# Patient Record
Sex: Female | Born: 1995 | Race: White | Hispanic: No | Marital: Single | Smoking: Never smoker
Health system: Southern US, Community
[De-identification: ages and names within clinical notes are randomized; demographics above are authoritative.]

## PROBLEM LIST (undated history)

## (undated) DIAGNOSIS — F3181 Bipolar II disorder: Secondary | ICD-10-CM

## (undated) HISTORY — PX: EXPLORATORY LAPAROTOMY: SUR591

## (undated) HISTORY — PX: DIAGNOSTIC LAPAROSCOPY: SUR761

## (undated) HISTORY — DX: Bipolar II disorder: F31.81

---
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
## 2017-02-20 DIAGNOSIS — F3181 Bipolar II disorder: Secondary | ICD-10-CM

## 2017-02-20 HISTORY — DX: Bipolar II disorder: F31.81

## 2017-07-21 HISTORY — PX: PELVIC LAPAROSCOPY: SHX162

## 2017-11-09 ENCOUNTER — Inpatient Hospital Stay: Payer: BLUE CROSS/BLUE SHIELD

## 2017-11-09 ENCOUNTER — Inpatient Hospital Stay: Payer: BLUE CROSS/BLUE SHIELD | Attending: Obstetrics | Admitting: Obstetrics

## 2017-11-09 ENCOUNTER — Encounter: Payer: Self-pay | Admitting: Obstetrics

## 2017-11-09 VITALS — BP 136/65 | HR 68 | Temp 98.4°F | Resp 20 | Ht 66.0 in | Wt 207.3 lb

## 2017-11-09 DIAGNOSIS — D3910 Neoplasm of uncertain behavior of unspecified ovary: Secondary | ICD-10-CM | POA: Insufficient documentation

## 2017-11-09 DIAGNOSIS — N949 Unspecified condition associated with female genital organs and menstrual cycle: Secondary | ICD-10-CM

## 2017-11-09 DIAGNOSIS — N838 Other noninflammatory disorders of ovary, fallopian tube and broad ligament: Secondary | ICD-10-CM | POA: Insufficient documentation

## 2017-11-09 LAB — CEA (IN HOUSE-CHCC): CEA (CHCC-In House): 1.76 ng/mL (ref 0.00–5.00)

## 2017-11-09 NOTE — Progress Notes (Addendum)
Consult Note: New Patient FIRST VISIT   Consult was requested by Dr. Dollene Primrose for a large pelvic mass (cyst) that reformed after [facility] drainage.   Chief Complaint  Patient presents with  . Ovarian mass    ONCOLOGIC SUMMARY 1. TBD o .  HPI: [name]  is a nice 22 y.o.  P0  Patient states she has noted a mobile mass in her abdomen for approximately 5 years. She thought this was just part of her anatomy.  She noted about 2 weeks of discomfort that increased to signficant pain and presented to her Gyn. She was taken for urgent surgery after a large pelvic mass was diagnosed.  The patient underwent laparoscopic drainage of a 25 cm ovarian cyst per office notes fluid was obtained and consistent with benign cells in addition a biopsy of the ovarian capsule was performed and.  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  In this setting a mucinous component cannot be entirely excluded.  No atypia or malignancy is seen in the sample tissue.    The patient then presented for repeat ultrasound which revealed either residual or reformed cystic lesion: Trans-vaginal ultrasound 10/26/2017 showing a uterus 8 x 3.4 x 3 cm a left ovary 4.5 x 2.7 x 1.5 cm.  The right ovary was 4.4 x 4 x 2 cm with a cyst measuring 13 x 14 x 9 cm.  Description is bilateral ovaries are seen the right ovary has a thin-walled cyst measuring 3 cm with low-level echoes suspect hemorrhagic cyst it is avascular.  The left adnexa reveals a large thin-walled cyst which was seen transabdominally 14 x 9 x 16 hyperechoic area was seen on the posterior wall of the cyst which was avascular left ovary was seen at the fundus of the uterus with blood flow.  Cul-de-sac showed minimal free fluid.  IUD was seen within the endometrium in proper position.  She is referred for management and recommendations. She denies N/V. Has normal BM. Denies fevers. Has a good  appetite.  Imported EPIC Oncologic History:   No history exists.    Measurement of disease:  TBD .   Radiology: . TVUS as noted above; nothing available for formal review on first visit  Outpatient Encounter Medications as of 11/09/2017  Medication Sig  . lamoTRIgine (LAMICTAL) 200 MG tablet Take 1 tablet by mouth daily.  Marland Kitchen levonorgestrel (MIRENA) 20 MCG/24HR IUD by Intrauterine route.  . methylphenidate 36 MG PO CR tablet Take 36 mg by mouth as needed. Patient states that she takes this medication as needed on her bad days.  . Multiple Vitamin (MULTIVITAMIN) tablet Take 1 tablet by mouth daily.   No facility-administered encounter medications on file as of 11/09/2017.    No Known Allergies  Past Medical History:  Diagnosis Date  . Bipolar 2 disorder ([place]) 02/2017   Past Surgical History:  Procedure Laterality Date  . PELVIC LAPAROSCOPY  07/2017  . TONSILLECTOMY AND ADENOIDECTOMY  2003  . Wisedom Tooth extraction  06/2016        Past Gynecological History:   GYNECOLOGIC HISTORY:  . No LMP recorded.  . Menarche: 22 years old . P 0 . Contraceptive Mirena IUD . HRT NA  . Last Pap 08/2017 negative  Family Hx:  Family History  Problem Relation Age of Onset  . Uterine cancer Other   . Diabetes Mother   . Thyroid disease Mother   . Hypertension Father   .  Diabetes Maternal Grandmother   . Rheum arthritis Maternal Grandmother    Social Hx:  Marland Kitchen Tobacco use: +ECig . Alcohol use: none . Illicit Drug use: Marijuana    Review of Systems: Review of Systems  Gastrointestinal: Positive for abdominal pain.  All other systems reviewed and are negative.   Vitals: Blood pressure 136/65, pulse 68, temperature 98.4 F (36.9 C), temperature source Oral, resp. rate 20, height 5\' 6"  (1.676 m), weight 207 lb 4.8 oz (94 kg), SpO2 100 %. Vitals:   11/09/17 0916  Weight: 207 lb 4.8 oz (94 kg)  Height: 5\' 6"  (1.676 m)    Vitals:   11/09/17 0916  BP: 136/65  Pulse: 68  Resp:  20  Temp: 98.4 F (36.9 C)  SpO2: 100%   Body mass index is 33.46 kg/m.   Physical Exam: General :  Overweight. Well developed, 22 y.o., female in no apparent distress HEENT:  Normocephalic/atraumatic, symmetric, EOMI, eyelids normal Neck:   Supple, no masses.  Lymphatics:  No cervical/ submandibular/ supraclavicular/ infraclavicular/ inguinal adenopathy Respiratory:  Respirations unlabored, no use of accessory muscles CV:   Deferred Breast:  Deferred Musculoskeletal: No CVA tenderness, normal muscle strength. Abdomen:  Overweight. Soft, non-tender and nondistended. No evidence of hernia. No masses. Difficult exam 2/2 habitus. Extremities:  No lymphedema, no erythema, non-tender. Skin:   Normal inspection Neuro/Psych:  No focal motor deficit, no abnormal mental status. Normal gait. Normal affect. Alert and oriented to person, place, and time  Genito Urinary: Vulva: Normal external female genitalia.  Bladder/urethra: Urethral meatus normal in size and location. No lesions or   masses, well supported bladder Speculum exam: Vagina: No lesion, no discharge, no bleeding. Cervix: Normal appearing, no lesions. Bimanual exam: habitus limits exam Uterus: Mobile.  Adnexa: No masses. Rectovaginal:  Good tone, no masses, no cul de sac nodularity, no parametrial involvement or nodularity.   Assessment  Pelvic mass  Plan  1. Data reviewed ? No images are available for independent review ? I did review in detail and note above the referring provider's ultrasound ? I will order a CTScan prior to surgery and we'll get another measurement on the size and determine if any extrapelvic lesions ? I reviewed her referring doctor's office notes and I have summarized in the HPI ? History was obtained from the patient mostly and the chart partly ? We reviewed she has not yet had tumor markers and so those will be ordered. ? We also reviewed her washings and the pathology report. 2. Pelvic  mass ? Etiology most likely reformation of cystic lesion previously drained. ? CTAP as above  3. Management ? Given the lesion size I recommend surgical resection. ? I discussed attempt at laparoscopic removal, but potential for minilap or full Exlap, possible staging. ? Regardless of pathology we will plan fertility-sparing procedure and retention of the contralateral ovary and uterus. o Surgical risks, benefits, and alternatives were reviewed using the surgical sketch and she was given a copy. 4. She wants to wait for a school break to proceed. ? We have cytology and a sample of the lesion already. ? I would like to get the CT and tumor markers  ? If those are reassuring then we can look at scheduling out when she has holiday break. ? I did explain the risk of torsion/urgent surgery while at school.  HAS an IUD - Need to consider when placing uterine manipulator  Face to face time with patient was 60 minutes. Over 50% of this  time was spent on counseling and coordination of care.  [name], MD  11/09/2017, 9:48 AM    Cc: Dollene Primrose, MD (Referring Ob/Gyn) none (PCP)

## 2017-11-09 NOTE — Patient Instructions (Signed)
We will obtain tumor markers today (CEA, CA 125, CA 19-9) and notify you with the results.  Also plan to have a CT scan of the abdomen and pelvis.                Preparing for your Surgery  Plan for surgery on January 14, 2018 with Dr. Precious Haws at [place] will be scheduled for a robotic assisted unilateral salpingo-oophorectomy with washings, possible exploratory laparotomy, possible staging if malignancy is identified.  Pre-operative Testing -You will receive a phone call from presurgical testing at [facility] to arrange for a pre-operative testing appointment before your surgery.  This appointment normally occurs one to two weeks before your scheduled surgery.   -Bring your insurance card, copy of an advanced directive if applicable, medication list  -At that visit, you will be asked to sign a consent for a possible blood transfusion in case a transfusion becomes necessary during surgery.  The need for a blood transfusion is rare but having consent is a necessary part of your care.     -You should not be taking blood thinners or aspirin at least ten days prior to surgery unless instructed by your surgeon.  Day Before Surgery at [place] will be asked to take in a light diet the day before surgery.  Avoid carbonated beverages.  You will be advised to have nothing to eat or drink after midnight the evening before.    Eat a light diet the day before surgery.  Examples including soups, broths, toast, yogurt, mashed potatoes.  Things to avoid include carbonated beverages (fizzy beverages), raw fruits and raw vegetables, or beans.   If your bowels are filled with gas, your surgeon will have difficulty visualizing your pelvic organs which increases your surgical risks.  Your role in recovery Your role is to become active as soon as directed by your doctor, while still giving yourself time to heal.  Rest when you feel tired. You will be asked to do the following  in order to speed your recovery:  - Cough and breathe deeply. This helps toclear and expand your lungs and can prevent pneumonia. You may be given a spirometer to practice deep breathing. A staff member will show you how to use the spirometer. - Do mild physical activity. Walking or moving your legs help your circulation and body functions return to normal. A staff member will help you when you try to walk and will provide you with simple exercises. Do not try to get up or walk alone the first time. - Actively manage your pain. Managing your pain lets you move in comfort. We will ask you to rate your pain on a scale of zero to 10. It is your responsibility to tell your doctor or nurse where and how much you hurt so your pain can be treated.  Special Considerations -If you are diabetic, you may be placed on insulin after surgery to have closer control over your blood sugars to promote healing and recovery.  This does not mean that you will be discharged on insulin.  If applicable, your oral antidiabetics will be resumed when you are tolerating a solid diet.  -Your final pathology results from surgery should be available by the Friday after surgery and the results will be relayed to you when available.  -Dr. Everitt Amber is the Surgeon that assists your GYN Oncologist with surgery.  The next day after your surgery you will either see your  GYN Oncologist, Dr. Denman George, or Dr. Lahoma Crocker.   Blood Transfusion Information WHAT IS A BLOOD TRANSFUSION? A transfusion is the replacement of blood or some of its parts. Blood is made up of multiple cells which provide different functions.  Red blood cells carry oxygen and are used for blood loss replacement.  White blood cells fight against infection.  Platelets control bleeding.  Plasma helps clot blood.  Other blood products are available for specialized needs, such as hemophilia or other clotting disorders. BEFORE THE TRANSFUSION  Who gives  blood for transfusions?   You may be able to donate blood to be used at a later date on yourself (autologous donation).  Relatives can be asked to donate blood. This is generally not any safer than if you have received blood from a stranger. The same precautions are taken to ensure safety when a relative's blood is donated.  Healthy volunteers who are fully evaluated to make sure their blood is safe. This is blood bank blood. Transfusion therapy is the safest it has ever been in the practice of medicine. Before blood is taken from a donor, a complete history is taken to make sure that person has no history of diseases nor engages in risky social behavior (examples are intravenous drug use or sexual activity with multiple partners). The donor's travel history is screened to minimize risk of transmitting infections, such as malaria. The donated blood is tested for signs of infectious diseases, such as HIV and hepatitis. The blood is then tested to be sure it is compatible with you in order to minimize the chance of a transfusion reaction. If you or a relative donates blood, this is often done in anticipation of surgery and is not appropriate for emergency situations. It takes many days to process the donated blood. RISKS AND COMPLICATIONS Although transfusion therapy is very safe and saves many lives, the main dangers of transfusion include:   Getting an infectious disease.  Developing a transfusion reaction. This is an allergic reaction to something in the blood you were given. Every precaution is taken to prevent this. The decision to have a blood transfusion has been considered carefully by your caregiver before blood is given. Blood is not given unless the benefits outweigh the risks.

## 2017-11-10 LAB — CA 125: Cancer Antigen (CA) 125: 23.4 U/mL (ref 0.0–38.1)

## 2017-11-10 LAB — CANCER ANTIGEN 19-9: CAN 19-9: 9 U/mL (ref 0–35)

## 2017-11-12 ENCOUNTER — Telehealth: Payer: Self-pay

## 2017-11-12 NOTE — Telephone Encounter (Signed)
Told Ms Warning that the CEA and CA-125 were with in normal range per Joylene John, NP. Pt verbalized understanding.

## 2017-11-16 ENCOUNTER — Ambulatory Visit (HOSPITAL_COMMUNITY)
Admission: RE | Admit: 2017-11-16 | Discharge: 2017-11-16 | Disposition: A | Discharge status: Home / Self Care | Payer: BLUE CROSS/BLUE SHIELD | Source: Ambulatory Visit | Attending: Obstetrics | Admitting: Obstetrics

## 2017-11-16 ENCOUNTER — Encounter (HOSPITAL_COMMUNITY): Payer: Self-pay | Admitting: Radiology

## 2017-11-16 DIAGNOSIS — K769 Liver disease, unspecified: Secondary | ICD-10-CM | POA: Diagnosis not present

## 2017-11-16 DIAGNOSIS — R188 Other ascites: Secondary | ICD-10-CM | POA: Diagnosis not present

## 2017-11-16 DIAGNOSIS — N949 Unspecified condition associated with female genital organs and menstrual cycle: Secondary | ICD-10-CM

## 2017-11-16 DIAGNOSIS — N9489 Other specified conditions associated with female genital organs and menstrual cycle: Secondary | ICD-10-CM | POA: Diagnosis not present

## 2017-11-16 MED ORDER — IOHEXOL 300 MG/ML  SOLN
75.0000 mL | Freq: Once | INTRAMUSCULAR | Status: AC | PRN
  Administered 2017-11-16: 75 mL via INTRAVENOUS

## 2017-11-16 MED ORDER — SODIUM CHLORIDE 0.9 % IJ SOLN
INTRAMUSCULAR | Status: AC
  Filled 2017-11-16: qty 50

## 2017-11-22 ENCOUNTER — Telehealth: Payer: Self-pay

## 2017-11-22 DIAGNOSIS — N838 Other noninflammatory disorders of ovary, fallopian tube and broad ligament: Secondary | ICD-10-CM

## 2017-11-22 DIAGNOSIS — Q991 46, XX true hermaphrodite: Secondary | ICD-10-CM

## 2017-11-22 DIAGNOSIS — K769 Liver disease, unspecified: Secondary | ICD-10-CM

## 2017-11-22 NOTE — Telephone Encounter (Signed)
Told Kaylee Porter that the CT showed the cystic mass that was previously known.  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 An abdominal MRI is recommended to evaluate the lesion per [facility]. 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

## 2017-12-07 NOTE — Telephone Encounter (Signed)
Ms Schey is working on a project for school at the moment.  She will call back next week with dates that she can have MRI of the Abdomen doe prior to surgery 01-14-18.

## 2017-12-21 NOTE — Telephone Encounter (Signed)
Spoke with Kaylee Porter and Kaylee Porter her about dates for MRI as Dr. Gerarda Fraction would like it prior to her surgery on 01-14-18.  Kaylee Porter stated that she was at work and would call later to discuss scheduling the MRI.

## 2018-01-02 ENCOUNTER — Telehealth: Payer: Self-pay | Admitting: Oncology

## 2018-01-02 NOTE — Telephone Encounter (Signed)
Called Kaylee Porter with appointment for pre-op testing on 01/15/18 at 8:30 am and then MRI at 10 am.  Advised her not to eat or drink anything 4 hours before the MRI.  She verbalized understanding and agreement.

## 2018-01-07 ENCOUNTER — Telehealth: Payer: Self-pay | Admitting: Oncology

## 2018-01-07 NOTE — Telephone Encounter (Signed)
Called Kaylee Porter regarding her insurance card.  She said she forgot to email it and will send it right now.

## 2018-01-09 ENCOUNTER — Telehealth: Payer: Self-pay | Admitting: Oncology

## 2018-01-09 NOTE — Telephone Encounter (Signed)
Left a message for Beth regarding insurance.  Requested a return call.

## 2018-01-11 ENCOUNTER — Inpatient Hospital Stay (HOSPITAL_COMMUNITY): Admission: RE | Admit: 2018-01-11 | Payer: BLUE CROSS/BLUE SHIELD | Source: Ambulatory Visit

## 2018-01-15 ENCOUNTER — Other Ambulatory Visit (HOSPITAL_COMMUNITY): Payer: BLUE CROSS/BLUE SHIELD

## 2018-01-15 ENCOUNTER — Ambulatory Visit (HOSPITAL_COMMUNITY): Admission: RE | Admit: 2018-01-15 | Payer: Managed Care, Other (non HMO) | Source: Ambulatory Visit

## 2018-01-21 ENCOUNTER — Ambulatory Visit: Payer: BLUE CROSS/BLUE SHIELD | Admitting: Obstetrics

## 2018-02-01 ENCOUNTER — Telehealth: Payer: Self-pay

## 2018-02-01 NOTE — Telephone Encounter (Signed)
Outgoing call per Joylene John NP regarding MRI results from this morning.  Per her, MRI results are "they feel like the area in the liver is benign, recommend follow up MRI in one year to make sure it is stable"  Voiced understanding. Per Melissa NP - I let pt know they will do further testing on tissue when she has her surgery and f/u MRI can be with her primary or PCP.  Pt voiced understanding and no other needs per her at this time.

## 2018-02-01 NOTE — Patient Instructions (Signed)
Kaylee Porter  02/01/2018   Your procedure is scheduled on: 02-12-18    Report to [facility] Main  Entrance    Report to admitting at 11:45 AM    Call this number if you have problems the morning of surgery [phone]    Eat a light diet the day before surgery.  Examples including soups, broths, toast, yogurt, mashed potatoes.  Things to avoid include carbonated beverages (fizzy beverages), raw fruits and raw vegetables, or beans.   If your bowels are filled with gas, your surgeon will have difficulty visualizing your pelvic organs which increases your surgical risks.   Remember: NO SOLID FOOD AFTER MIDNIGHT THE NIGHT PRIOR TO SURGERY. NOTHING BY MOUTH EXCEPT CLEAR LIQUIDS UNTIL 3 HOURS PRIOR TO [place] SURGERY. PLEASE FINISH ENSURE DRINK PER SURGEON ORDER 3 HOURS PRIOR TO SCHEDULED SURGERY TIME WHICH NEEDS TO BE COMPLETED AT 10:45 AM.    CLEAR LIQUID DIET   Foods Allowed                                                                     Foods Excluded  Coffee and tea, regular and decaf                             liquids that you cannot  Plain Jell-O in any flavor                                             see through such as: Fruit ices (not with fruit pulp)                                     milk, soups, orange juice  Iced Popsicles                                    All solid food Carbonated beverages, regular and diet                                    Cranberry, grape and apple juices Sports drinks like Gatorade Lightly seasoned clear broth or consume(fat free) Sugar, honey syrup  Sample Menu Breakfast                                Lunch                                     Supper Cranberry juice                    Beef broth  Chicken broth Jell-O                                     Grape juice                           Apple juice Coffee or tea                        Jell-O                                       Popsicle                                                Coffee or tea                        Coffee or tea  _____________________________________________________________________    BRUSH YOUR TEETH MORNING OF SURGERY AND RINSE YOUR MOUTH OUT, NO CHEWING GUM CANDY OR MINTS.     Take these medicines the morning of surgery with A SIP OF WATER: None                                 You may not have any metal on your body including hair pins and              piercings  Do not wear jewelry, make-up, lotions, powders or perfumes, deodorant             Do not wear nail polish.  Do not shave  48 hours prior to surgery.        Do not bring valuables to the hospital. [place].  Contacts, dentures or bridgework may not be worn into surgery.  Leave suitcase in the car. After surgery it may be brought to your room.      Special Instructions: Cough and Deep Breath Exercises              Please read over the following fact sheets you were given: _____________________________________________________________________             [facility] - Preparing for Surgery Before surgery, you can play an important role.  Because skin is not sterile, your skin needs to be as free of germs as possible.  You can reduce the number of germs on your skin by washing with CHG (chlorahexidine gluconate) soap before surgery.  CHG is an antiseptic cleaner which kills germs and bonds with the skin to continue killing germs even after washing. Please DO NOT use if you have an allergy to CHG or antibacterial soaps.  If your skin becomes reddened/irritated stop using the CHG and inform your nurse when you arrive at Short Stay. Do not shave (including legs and underarms) for at least 48 hours prior to the first CHG shower.  You may shave your face/neck. Please follow these instructions carefully:  1.  Shower with CHG Soap the night  before surgery and the  morning of  Surgery.  2.  If you choose to wash your hair, wash your hair first as usual with your  normal  shampoo.  3.  After you shampoo, rinse your hair and body thoroughly to remove the  shampoo.                           4.  Use CHG as you would any other liquid soap.  You can apply chg directly  to the skin and wash                       Gently with a scrungie or clean washcloth.  5.  Apply the CHG Soap to your body ONLY FROM THE NECK DOWN.   Do not use on face/ open                           Wound or open sores. Avoid contact with eyes, ears mouth and genitals (private parts).                       Wash face,  Genitals (private parts) with your normal soap.             6.  Wash thoroughly, paying special attention to the area where your surgery  will be performed.  7.  Thoroughly rinse your body with warm water from the neck down.  8.  DO NOT shower/wash with your normal soap after using and rinsing off  the CHG Soap.                9.  Pat yourself dry with a clean towel.            10.  Wear clean pajamas.            11.  Place clean sheets on your bed the night of your first shower and do not  sleep with pets. Day of Surgery : Do not apply any lotions/deodorants the morning of surgery.  Please wear clean clothes to the hospital/surgery center.  FAILURE TO FOLLOW THESE INSTRUCTIONS MAY RESULT IN THE CANCELLATION OF YOUR SURGERY PATIENT SIGNATURE_________________________________  NURSE SIGNATURE__________________________________  ________________________________________________________________________   Adam [place]ix  An incentive spirometer is a tool that can help keep your lungs clear and active. This tool measures how well you are filling your lungs with each breath. Taking long deep breaths may help reverse or decrease the chance of developing breathing (pulmonary) problems (especially infection) following:  A long period of time when you are unable to move or be active. BEFORE  THE PROCEDURE   If the spirometer includes an indicator to show your best effort, your nurse or respiratory therapist will set it to a desired goal.  If possible, sit up straight or lean slightly forward. Try not to slouch.  Hold the incentive spirometer in an upright position. INSTRUCTIONS FOR USE  1. Sit on the edge of your bed if possible, or sit up as far as you can in bed or on a chair. 2. Hold the incentive spirometer in an upright position. 3. Breathe out normally. 4. Place the mouthpiece in your mouth and seal your lips tightly around it. 5. Breathe in slowly and as deeply as possible, raising the piston or the ball toward the top of the column. 6. Hold your breath for 3-5 seconds  or for as long as possible. Allow the piston or ball to fall to the bottom of the column. 7. Remove the mouthpiece from your mouth and breathe out normally. 8. Rest for a few seconds and repeat Steps 1 through 7 at least 10 times every 1-2 hours when you are awake. Take your time and take a few normal breaths between deep breaths. 9. The spirometer may include an indicator to show your best effort. Use the indicator as a goal to work toward during each repetition. 10. After each set of 10 deep breaths, practice coughing to be sure your lungs are clear. If you have an incision (the cut made at the time of surgery), support your incision when coughing by placing a pillow or rolled up towels firmly against it. Once you are able to get out of bed, walk around indoors and cough well. You may stop using the incentive spirometer when instructed by your caregiver.  RISKS AND COMPLICATIONS  Take your time so you do not get dizzy or light-headed.  If you are in pain, you may need to take or ask for pain medication before doing incentive spirometry. It is harder to take a deep breath if you are having pain. AFTER USE  Rest and breathe slowly and easily.  It can be helpful to keep track of a log of your progress.  Your caregiver can provide you with a simple table to help with this. If you are using the spirometer at home, follow these instructions: [place] IF:   You are having difficultly using the spirometer.  You have trouble using the spirometer as often as instructed.  Your pain medication is not giving enough relief while using the spirometer.  You develop fever of 100.5 F (38.1 C) or higher. SEEK IMMEDIATE MEDICAL CARE IF:   You cough up bloody sputum that had not been present before.  You develop fever of 102 F (38.9 C) or greater.  You develop worsening pain at or near the incision site. MAKE SURE YOU:   Understand these instructions.  Will watch your condition.  Will get help right away if you are not doing well or get worse. Document Released: 06/19/2006 Document Revised: 05/01/2011 Document Reviewed: 08/20/2006 Exit[employer] Patient Information 2014 [employer], [place].   ________________________________________________________________________  WHAT IS A BLOOD TRANSFUSION? Blood Transfusion Information  A transfusion is the replacement of blood or some of its parts. Blood is made up of multiple cells which provide different functions.  Red blood cells carry oxygen and are used for blood loss replacement.  White blood cells fight against infection.  Platelets control bleeding.  Plasma helps clot blood.  Other blood products are available for specialized needs, such as hemophilia or other clotting disorders. BEFORE THE TRANSFUSION  Who gives blood for transfusions?   Healthy volunteers who are fully evaluated to make sure their blood is safe. This is blood bank blood. Transfusion therapy is the safest it has ever been in the practice of medicine. Before blood is taken from a donor, a complete history is taken to make sure that person has no history of diseases nor engages in risky social behavior (examples are intravenous drug use or sexual activity with multiple  partners). The donor's travel history is screened to minimize risk of transmitting infections, such as malaria. The donated blood is tested for signs of infectious diseases, such as HIV and hepatitis. The blood is then tested to be sure it is compatible with you in order to  minimize the chance of a transfusion reaction. If you or a relative donates blood, this is often done in anticipation of surgery and is not appropriate for emergency situations. It takes many days to process the donated blood. RISKS AND COMPLICATIONS Although transfusion therapy is very safe and saves many lives, the main dangers of transfusion include:   Getting an infectious disease.  Developing a transfusion reaction. This is an allergic reaction to something in the blood you were given. Every precaution is taken to prevent this. The decision to have a blood transfusion has been considered carefully by your caregiver before blood is given. Blood is not given unless the benefits outweigh the risks. AFTER THE TRANSFUSION  Right after receiving a blood transfusion, you will usually feel much better and more energetic. This is especially true if your red blood cells have gotten low (anemic). The transfusion raises the level of the red blood cells which carry oxygen, and this usually causes an energy increase.  The nurse administering the transfusion will monitor you carefully for complications. HOME CARE INSTRUCTIONS  No special instructions are needed after a transfusion. You may find your energy is better. Speak with your caregiver about any limitations on activity for underlying diseases you may have. SEEK MEDICAL CARE IF:   Your condition is not improving after your transfusion.  You develop redness or irritation at the intravenous (IV) site. SEEK IMMEDIATE MEDICAL CARE IF:  Any of the following symptoms occur over the next 12 hours:  Shaking chills.  You have a temperature by mouth above 102 F (38.9 C), not  controlled by medicine.  Chest, back, or muscle pain.  People around you feel you are not acting correctly or are confused.  Shortness of breath or difficulty breathing.  Dizziness and fainting.  You get a rash or develop hives.  You have a decrease in urine output.  Your urine turns a dark color or changes to pink, red, or brown. Any of the following symptoms occur over the next 10 days:  You have a temperature by mouth above 102 F (38.9 C), not controlled by medicine.  Shortness of breath.  Weakness after normal activity.  The white part of the eye turns yellow (jaundice).  You have a decrease in the amount of urine or are urinating less often.  Your urine turns a dark color or changes to pink, red, or brown. Document Released: 02/04/2000 Document Revised: 05/01/2011 Document Reviewed: 09/23/2007 [facility] Patient Information 2014 [place], [place].  _______________________________________________________________________

## 2018-02-04 ENCOUNTER — Inpatient Hospital Stay (HOSPITAL_COMMUNITY)
Admission: RE | Admit: 2018-02-04 | Discharge: 2018-02-04 | Disposition: A | Discharge status: Home / Self Care | Payer: Managed Care, Other (non HMO) | Source: Ambulatory Visit

## 2018-02-11 ENCOUNTER — Encounter (HOSPITAL_COMMUNITY): Payer: Self-pay

## 2018-02-11 ENCOUNTER — Telehealth: Payer: Self-pay | Admitting: *Deleted

## 2018-02-11 ENCOUNTER — Encounter (HOSPITAL_COMMUNITY)
Admission: RE | Admit: 2018-02-11 | Discharge: 2018-02-11 | Disposition: A | Discharge status: Home / Self Care | Payer: Managed Care, Other (non HMO) | Source: Ambulatory Visit | Attending: Obstetrics | Admitting: Obstetrics

## 2018-02-11 ENCOUNTER — Other Ambulatory Visit: Payer: Self-pay

## 2018-02-11 DIAGNOSIS — Z01812 Encounter for preprocedural laboratory examination: Secondary | ICD-10-CM | POA: Insufficient documentation

## 2018-02-11 DIAGNOSIS — N736 Female pelvic peritoneal adhesions (postinfective): Secondary | ICD-10-CM | POA: Diagnosis not present

## 2018-02-11 DIAGNOSIS — D4959 Neoplasm of unspecified behavior of other genitourinary organ: Secondary | ICD-10-CM | POA: Diagnosis not present

## 2018-02-11 DIAGNOSIS — R1900 Intra-abdominal and pelvic swelling, mass and lump, unspecified site: Secondary | ICD-10-CM | POA: Insufficient documentation

## 2018-02-11 LAB — URINALYSIS, ROUTINE W REFLEX MICROSCOPIC
Bacteria, UA: NONE SEEN
Bilirubin Urine: NEGATIVE
Glucose, UA: NEGATIVE mg/dL
Ketones, ur: 5 mg/dL — AB
Leukocytes, UA: NEGATIVE
NITRITE: NEGATIVE
Protein, ur: NEGATIVE mg/dL
Specific Gravity, Urine: 1.006 (ref 1.005–1.030)
pH: 6 (ref 5.0–8.0)

## 2018-02-11 LAB — COMPREHENSIVE METABOLIC PANEL
ALT: 14 U/L (ref 0–44)
AST: 20 U/L (ref 15–41)
Albumin: 4.6 g/dL (ref 3.5–5.0)
Alkaline Phosphatase: 54 U/L (ref 38–126)
Anion gap: 9 (ref 5–15)
BUN: 8 mg/dL (ref 6–20)
CHLORIDE: 105 mmol/L (ref 98–111)
CO2: 26 mmol/L (ref 22–32)
Calcium: 9.6 mg/dL (ref 8.9–10.3)
Creatinine, Ser: 0.74 mg/dL (ref 0.44–1.00)
GFR calc Af Amer: >60 mL/min (ref >60)
GFR calc non Af Amer: >60 mL/min (ref >60)
Glucose, Bld: 102 mg/dL — ABNORMAL HIGH (ref 70–99)
Potassium: 4 mmol/L (ref 3.5–5.1)
Sodium: 140 mmol/L (ref 135–145)
Total Bilirubin: 0.8 mg/dL (ref 0.3–1.2)
Total Protein: 6.8 g/dL (ref 6.5–8.1)

## 2018-02-11 LAB — CBC
HCT: 43.5 % (ref 36.0–46.0)
Hemoglobin: 13.2 g/dL (ref 12.0–15.0)
MCH: 26.2 pg (ref 26.0–34.0)
MCHC: 30.3 g/dL (ref 30.0–36.0)
MCV: 86.3 fL (ref 80.0–100.0)
NRBC: 0 % (ref 0.0–0.2)
Platelets: 234 10*3/uL (ref 150–400)
RBC: 5.04 MIL/uL (ref 3.87–5.11)
RDW: 14.4 % (ref 11.5–15.5)
WBC: 6.2 10*3/uL (ref 4.0–10.5)

## 2018-02-11 LAB — PREGNANCY, URINE: Preg Test, Ur: NEGATIVE

## 2018-02-11 LAB — ABO/RH: ABO/RH(D): O POS

## 2018-02-11 NOTE — Telephone Encounter (Signed)
Patient called back and was given the new appt for this afternoon

## 2018-02-11 NOTE — Telephone Encounter (Signed)
Called and left the patient a message to call the office back. Patient missed her per op appt this morning.

## 2018-02-11 NOTE — Patient Instructions (Addendum)
Kaylee Porter  02/11/2018   Your procedure is scheduled on: 02-12-18  Report to [facility] Main  Entrance  Report to admitting at         0830 AM    Call this number if you have problems the morning of surgery [phone]    Remember: NO SOLID FOOD AFTER MIDNIGHT THE NIGHT PRIOR TO SURGERY. NOTHING BY MOUTH EXCEPT CLEAR LIQUIDS UNTIL 3 HOURS PRIOR TO [place] SURGERY. PLEASE FINISH ENSURE DRINK PER SURGEON ORDER 3 HOURS PRIOR TO SCHEDULED SURGERY TIME WHICH NEEDS TO BE COMPLETED AT _______0730 am then nothing by mouth_____.    BRUSH YOUR TEETH MORNING OF SURGERY AND RINSE YOUR MOUTH OUT, NO CHEWING GUM CANDY OR MINTS.     Take these medicines the morning of surgery with A SIP OF WATER: none                                You may not have any metal on your body including hair pins and              piercings  Do not wear jewelry, make-up, lotions, powders or perfumes, deodorant             Do not wear nail polish.  Do not shave  48 hours prior to surgery.             Do not bring valuables to the hospital. [place].  Contacts, dentures or bridgework may not be worn into surgery.  Leave suitcase in the car. After surgery it may be brought to your room.     Patients discharged the day of surgery will not be allowed to drive home.  Name and phone number of your driver:  Special Instructions: N/A              Please read over the following fact sheets you were given: _____________________________________________________________________             [facility] - Preparing for Surgery Before surgery, you can play an important role.  Because skin is not sterile, your skin needs to be as free of germs as possible.  You can reduce the number of germs on your skin by washing with CHG (chlorahexidine gluconate) soap before surgery.  CHG is an antiseptic cleaner which kills germs and bonds with the skin to  continue killing germs even after washing. Please DO NOT use if you have an allergy to CHG or antibacterial soaps.  If your skin becomes reddened/irritated stop using the CHG and inform your nurse when you arrive at Short Stay. Do not shave (including legs and underarms) for at least 48 hours prior to the first CHG shower.  You may shave your face/neck. Please follow these instructions carefully:  1.  Shower with CHG Soap the night before surgery and the  morning of Surgery.  2.  If you choose to wash your hair, wash your hair first as usual with your  normal  shampoo.  3.  After you shampoo, rinse your hair and body thoroughly to remove the  shampoo.  4.  Use CHG as you would any other liquid soap.  You can apply chg directly  to the skin and wash                       Gently with a scrungie or clean washcloth.  5.  Apply the CHG Soap to your body ONLY FROM THE NECK DOWN.   Do not use on face/ open                           Wound or open sores. Avoid contact with eyes, ears mouth and genitals (private parts).                       Wash face,  Genitals (private parts) with your normal soap.             6.  Wash thoroughly, paying special attention to the area where your surgery  will be performed.  7.  Thoroughly rinse your body with warm water from the neck down.  8.  DO NOT shower/wash with your normal soap after using and rinsing off  the CHG Soap.                9.  Pat yourself dry with a clean towel.            10.  Wear clean pajamas.            11.  Place clean sheets on your bed the night of your first shower and do not  sleep with pets. Day of Surgery : Do not apply any lotions/deodorants the morning of surgery.  Please wear clean clothes to the hospital/surgery center.  FAILURE TO FOLLOW THESE INSTRUCTIONS MAY RESULT IN THE CANCELLATION OF YOUR SURGERY PATIENT SIGNATURE_________________________________  NURSE  SIGNATURE__________________________________  ________________________________________________________________________  WHAT IS A BLOOD TRANSFUSION? Blood Transfusion Information  A transfusion is the replacement of blood or some of its parts. Blood is made up of multiple cells which provide different functions.  Red blood cells carry oxygen and are used for blood loss replacement.  White blood cells fight against infection.  Platelets control bleeding.  Plasma helps clot blood.  Other blood products are available for specialized needs, such as hemophilia or other clotting disorders. BEFORE THE TRANSFUSION  Who gives blood for transfusions?   Healthy volunteers who are fully evaluated to make sure their blood is safe. This is blood bank blood. Transfusion therapy is the safest it has ever been in the practice of medicine. Before blood is taken from a donor, a complete history is taken to make sure that person has no history of diseases nor engages in risky social behavior (examples are intravenous drug use or sexual activity with multiple partners). The donor's travel history is screened to minimize risk of transmitting infections, such as malaria. The donated blood is tested for signs of infectious diseases, such as HIV and hepatitis. The blood is then tested to be sure it is compatible with you in order to minimize the chance of a transfusion reaction. If you or a relative donates blood, this is often done in anticipation of surgery and is not appropriate for emergency situations. It takes many days to process the donated blood. RISKS AND COMPLICATIONS Although transfusion therapy is very safe and saves many lives, the main dangers of transfusion include:   Getting an infectious disease.  Developing a transfusion reaction. This  is an allergic reaction to something in the blood you were given. Every precaution is taken to prevent this. The decision to have a blood transfusion has been  considered carefully by your caregiver before blood is given. Blood is not given unless the benefits outweigh the risks. AFTER THE TRANSFUSION  Right after receiving a blood transfusion, you will usually feel much better and more energetic. This is especially true if your red blood cells have gotten low (anemic). The transfusion raises the level of the red blood cells which carry oxygen, and this usually causes an energy increase.  The nurse administering the transfusion will monitor you carefully for complications. HOME CARE INSTRUCTIONS  No special instructions are needed after a transfusion. You may find your energy is better. Speak with your caregiver about any limitations on activity for underlying diseases you may have. SEEK MEDICAL CARE IF:   Your condition is not improving after your transfusion.  You develop redness or irritation at the intravenous (IV) site. SEEK IMMEDIATE MEDICAL CARE IF:  Any of the following symptoms occur over the next 12 hours:  Shaking chills.  You have a temperature by mouth above 102 F (38.9 C), not controlled by medicine.  Chest, back, or muscle pain.  People around you feel you are not acting correctly or are confused.  Shortness of breath or difficulty breathing.  Dizziness and fainting.  You get a rash or develop hives.  You have a decrease in urine output.  Your urine turns a dark color or changes to pink, red, or brown. Any of the following symptoms occur over the next 10 days:  You have a temperature by mouth above 102 F (38.9 C), not controlled by medicine.  Shortness of breath.  Weakness after normal activity.  The white part of the eye turns yellow (jaundice).  You have a decrease in the amount of urine or are urinating less often.  Your urine turns a dark color or changes to pink, red, or brown. Document Released: 02/04/2000 Document Revised: 05/01/2011 Document Reviewed: 09/23/2007 [employer] Patient Information 2014  [place].  _______________________________________________________________________  Incentive Spirometer  An incentive spirometer is a tool that can help keep your lungs clear and active. This tool measures how well you are filling your lungs with each breath. Taking long deep breaths may help reverse or decrease the chance of developing breathing (pulmonary) problems (especially infection) following:  A long period of time when you are unable to move or be active. BEFORE THE PROCEDURE   If the spirometer includes an indicator to show your best effort, your nurse or respiratory therapist will set it to a desired goal.  If possible, sit up straight or lean slightly forward. Try not to slouch.  Hold the incentive spirometer in an upright position. INSTRUCTIONS FOR USE  1. Sit on the edge of your bed if possible, or sit up as far as you can in bed or on a chair. 2. Hold the incentive spirometer in an upright position. 3. Breathe out normally. 4. Place the mouthpiece in your mouth and seal your lips tightly around it. 5. Breathe in slowly and as deeply as possible, raising the piston or the ball toward the top of the column. 6. Hold your breath for 3-5 seconds or for as long as possible. Allow the piston or ball to fall to the bottom of the column. 7. Remove the mouthpiece from your mouth and breathe out normally. 8. Rest for a few seconds and repeat Steps 1  through 7 at least 10 times every 1-2 hours when you are awake. Take your time and take a few normal breaths between deep breaths. 9. The spirometer may include an indicator to show your best effort. Use the indicator as a goal to work toward during each repetition. 10. After each set of 10 deep breaths, practice coughing to be sure your lungs are clear. If you have an incision (the cut made at the time of surgery), support your incision when coughing by placing a pillow or rolled up towels firmly against it. Once you are able to get  out of bed, walk around indoors and cough well. You may stop using the incentive spirometer when instructed by your caregiver.  RISKS AND COMPLICATIONS  Take your time so you do not get dizzy or light-headed.  If you are in pain, you may need to take or ask for pain medication before doing incentive spirometry. It is harder to take a deep breath if you are having pain. AFTER USE  Rest and breathe slowly and easily.  It can be helpful to keep track of a log of your progress. Your caregiver can provide you with a simple table to help with this. If you are using the spirometer at home, follow these instructions: [place] IF:   You are having difficultly using the spirometer.  You have trouble using the spirometer as often as instructed.  Your pain medication is not giving enough relief while using the spirometer.  You develop fever of 100.5 F (38.1 C) or higher. SEEK IMMEDIATE MEDICAL CARE IF:   You cough up bloody sputum that had not been present before.  You develop fever of 102 F (38.9 C) or greater.  You develop worsening pain at or near the incision site. MAKE SURE YOU:   Understand these instructions.  Will watch your condition.  Will get help right away if you are not doing well or get worse. Document Released: 06/19/2006 Document Revised: 05/01/2011 Document Reviewed: 08/20/2006 [facility] Patient Information 2014 [place], [place].   ________________________________________________________________________

## 2018-02-12 ENCOUNTER — Encounter (HOSPITAL_COMMUNITY): Payer: Self-pay | Admitting: Certified Registered Nurse Anesthetist

## 2018-02-12 ENCOUNTER — Ambulatory Visit (HOSPITAL_COMMUNITY): Payer: Managed Care, Other (non HMO) | Admitting: Certified Registered Nurse Anesthetist

## 2018-02-12 ENCOUNTER — Ambulatory Visit (HOSPITAL_COMMUNITY)
Admission: RE | Admit: 2018-02-12 | Discharge: 2018-02-13 | Disposition: A | Discharge status: Home / Self Care | Payer: Managed Care, Other (non HMO) | Attending: Obstetrics | Admitting: Obstetrics

## 2018-02-12 ENCOUNTER — Encounter (HOSPITAL_COMMUNITY)
Admission: RE | Disposition: A | Discharge status: Home / Self Care | Payer: Self-pay | Source: Home / Self Care | Attending: Obstetrics

## 2018-02-12 DIAGNOSIS — D4959 Neoplasm of unspecified behavior of other genitourinary organ: Secondary | ICD-10-CM | POA: Insufficient documentation

## 2018-02-12 DIAGNOSIS — N736 Female pelvic peritoneal adhesions (postinfective): Secondary | ICD-10-CM | POA: Insufficient documentation

## 2018-02-12 DIAGNOSIS — R1900 Intra-abdominal and pelvic swelling, mass and lump, unspecified site: Secondary | ICD-10-CM | POA: Diagnosis not present

## 2018-02-12 DIAGNOSIS — N838 Other noninflammatory disorders of ovary, fallopian tube and broad ligament: Secondary | ICD-10-CM

## 2018-02-12 HISTORY — PX: LAPAROTOMY: SHX154

## 2018-02-12 HISTORY — PX: SALPINGOOPHORECTOMY: SHX82

## 2018-02-12 SURGERY — LAPAROTOMY, EXPLORATORY
Anesthesia: General

## 2018-02-12 MED ORDER — EPHEDRINE SULFATE-NACL 50-0.9 MG/10ML-% IV SOSY
PREFILLED_SYRINGE | INTRAVENOUS | Status: DC | PRN
  Administered 2018-02-12: 10 mg via INTRAVENOUS

## 2018-02-12 MED ORDER — ROCURONIUM BROMIDE 10 MG/ML (PF) SYRINGE
PREFILLED_SYRINGE | INTRAVENOUS | Status: AC
  Filled 2018-02-12: qty 10

## 2018-02-12 MED ORDER — ONDANSETRON HCL 4 MG/2ML IJ SOLN
INTRAMUSCULAR | Status: DC | PRN
  Administered 2018-02-12: 4 mg via INTRAVENOUS

## 2018-02-12 MED ORDER — CHEWING GUM (ORBIT) SUGAR FREE
1.0000 | CHEWING_GUM | Freq: Three times a day (TID) | ORAL | Status: DC
  Filled 2018-02-12: qty 1

## 2018-02-12 MED ORDER — SODIUM CHLORIDE (PF) 0.9 % IJ SOLN
INTRAMUSCULAR | Status: AC
  Filled 2018-02-12: qty 50

## 2018-02-12 MED ORDER — PREGABALIN 75 MG PO CAPS: 75.0000 mg | ORAL_CAPSULE | Freq: Two times a day (BID) | ORAL | Status: DC

## 2018-02-12 MED ORDER — LACTATED RINGERS IV SOLN
INTRAVENOUS | Status: DC
  Administered 2018-02-12: 11:00:00 via INTRAVENOUS
  Administered 2018-02-12: 1000 mL via INTRAVENOUS

## 2018-02-12 MED ORDER — DEXAMETHASONE SODIUM PHOSPHATE 4 MG/ML IJ SOLN: 4.0000 mg | INTRAMUSCULAR | Status: DC

## 2018-02-12 MED ORDER — BUPIVACAINE HCL (PF) 0.25 % IJ SOLN
INTRAMUSCULAR | Status: AC
  Filled 2018-02-12: qty 30

## 2018-02-12 MED ORDER — FENTANYL CITRATE (PF) 100 MCG/2ML IJ SOLN
INTRAMUSCULAR | Status: AC
  Filled 2018-02-12: qty 4

## 2018-02-12 MED ORDER — GABAPENTIN 300 MG PO CAPS
300.0000 mg | ORAL_CAPSULE | ORAL | Status: AC
  Administered 2018-02-12: 300 mg via ORAL
  Filled 2018-02-12: qty 1

## 2018-02-12 MED ORDER — MIDAZOLAM HCL 2 MG/2ML IJ SOLN
INTRAMUSCULAR | Status: AC
  Filled 2018-02-12: qty 2

## 2018-02-12 MED ORDER — LITHIUM CARBONATE 300 MG PO CAPS
300.0000 mg | ORAL_CAPSULE | Freq: Every day | ORAL | Status: DC
  Administered 2018-02-12: 300 mg via ORAL
  Filled 2018-02-12: qty 1

## 2018-02-12 MED ORDER — KCL IN DEXTROSE-NACL 20-5-0.45 MEQ/L-%-% IV SOLN
INTRAVENOUS | Status: DC
  Administered 2018-02-12: 17:00:00 via INTRAVENOUS
  Filled 2018-02-12: qty 1000

## 2018-02-12 MED ORDER — OXYCODONE HCL 5 MG PO TABS: 5.0000 mg | ORAL_TABLET | ORAL | Status: DC | PRN

## 2018-02-12 MED ORDER — KETOROLAC TROMETHAMINE 30 MG/ML IJ SOLN
15.0000 mg | Freq: Once | INTRAMUSCULAR | Status: AC
  Administered 2018-02-12: 15 mg via INTRAVENOUS

## 2018-02-12 MED ORDER — 0.9 % SODIUM CHLORIDE (POUR BTL) OPTIME
TOPICAL | Status: DC | PRN
  Administered 2018-02-12: 2000 mL

## 2018-02-12 MED ORDER — MIDAZOLAM HCL 5 MG/5ML IJ SOLN
INTRAMUSCULAR | Status: DC | PRN
  Administered 2018-02-12: 2 mg via INTRAVENOUS

## 2018-02-12 MED ORDER — LIDOCAINE 2% (20 MG/ML) 5 ML SYRINGE
INTRAMUSCULAR | Status: AC
  Filled 2018-02-12: qty 5

## 2018-02-12 MED ORDER — DEXMEDETOMIDINE HCL IN NACL 200 MCG/50ML IV SOLN
INTRAVENOUS | Status: DC | PRN
  Administered 2018-02-12: 12 ug via INTRAVENOUS

## 2018-02-12 MED ORDER — LIDOCAINE 2% (20 MG/ML) 5 ML SYRINGE
INTRAMUSCULAR | Status: DC | PRN
  Administered 2018-02-12: 60 mg via INTRAVENOUS

## 2018-02-12 MED ORDER — ROCURONIUM BROMIDE 10 MG/ML (PF) SYRINGE
PREFILLED_SYRINGE | INTRAVENOUS | Status: DC | PRN
  Administered 2018-02-12: 50 mg via INTRAVENOUS

## 2018-02-12 MED ORDER — FENTANYL CITRATE (PF) 250 MCG/5ML IJ SOLN
INTRAMUSCULAR | Status: DC | PRN
  Administered 2018-02-12 (×5): 50 ug via INTRAVENOUS

## 2018-02-12 MED ORDER — KETOROLAC TROMETHAMINE 15 MG/ML IJ SOLN
INTRAMUSCULAR | Status: AC
  Administered 2018-02-12: 15 mg
  Filled 2018-02-12: qty 1

## 2018-02-12 MED ORDER — LAMOTRIGINE 100 MG PO TABS
200.0000 mg | ORAL_TABLET | Freq: Every day | ORAL | Status: DC
  Administered 2018-02-12: 200 mg via ORAL
  Filled 2018-02-12: qty 2

## 2018-02-12 MED ORDER — OXYCODONE-ACETAMINOPHEN 5-325 MG PO TABS: 1.0000 | ORAL_TABLET | ORAL | 0 refills | Status: AC | PRN

## 2018-02-12 MED ORDER — ACETAMINOPHEN 500 MG PO TABS
1000.0000 mg | ORAL_TABLET | Freq: Four times a day (QID) | ORAL | Status: DC
  Administered 2018-02-12 – 2018-02-13 (×3): 1000 mg via ORAL
  Filled 2018-02-12 (×3): qty 2

## 2018-02-12 MED ORDER — ONDANSETRON HCL 4 MG/2ML IJ SOLN: 4.0000 mg | Freq: Four times a day (QID) | INTRAMUSCULAR | Status: DC | PRN

## 2018-02-12 MED ORDER — IBUPROFEN 200 MG PO TABS: 600.0000 mg | ORAL_TABLET | Freq: Four times a day (QID) | ORAL | Status: DC

## 2018-02-12 MED ORDER — SUGAMMADEX SODIUM 200 MG/2ML IV SOLN
INTRAVENOUS | Status: AC
  Filled 2018-02-12: qty 2

## 2018-02-12 MED ORDER — DOCUSATE SODIUM 100 MG PO CAPS
100.0000 mg | ORAL_CAPSULE | Freq: Two times a day (BID) | ORAL | Status: DC
  Administered 2018-02-12: 100 mg via ORAL
  Filled 2018-02-12: qty 1

## 2018-02-12 MED ORDER — ONDANSETRON HCL 4 MG PO TABS: 4.0000 mg | ORAL_TABLET | Freq: Four times a day (QID) | ORAL | Status: DC | PRN

## 2018-02-12 MED ORDER — CELECOXIB 200 MG PO CAPS
400.0000 mg | ORAL_CAPSULE | ORAL | Status: AC
  Administered 2018-02-12: 400 mg via ORAL
  Filled 2018-02-12: qty 2

## 2018-02-12 MED ORDER — CEFAZOLIN SODIUM-DEXTROSE 2-4 GM/100ML-% IV SOLN
2.0000 g | INTRAVENOUS | Status: AC
  Administered 2018-02-12: 2 g via INTRAVENOUS
  Filled 2018-02-12: qty 100

## 2018-02-12 MED ORDER — BUPIVACAINE LIPOSOME 1.3 % IJ SUSP
20.0000 mL | Freq: Once | INTRAMUSCULAR | Status: AC
  Administered 2018-02-12: 20 mL
  Filled 2018-02-12: qty 20

## 2018-02-12 MED ORDER — DEXAMETHASONE SODIUM PHOSPHATE 10 MG/ML IJ SOLN
INTRAMUSCULAR | Status: DC | PRN
  Administered 2018-02-12: 10 mg via INTRAVENOUS

## 2018-02-12 MED ORDER — PHENYLEPHRINE 40 MCG/ML (10ML) SYRINGE FOR IV PUSH (FOR BLOOD PRESSURE SUPPORT)
PREFILLED_SYRINGE | INTRAVENOUS | Status: DC | PRN
  Administered 2018-02-12: 80 ug via INTRAVENOUS

## 2018-02-12 MED ORDER — SCOPOLAMINE 1 MG/3DAYS TD PT72
1.0000 | MEDICATED_PATCH | TRANSDERMAL | Status: DC
  Administered 2018-02-12: 1.5 mg via TRANSDERMAL
  Filled 2018-02-12: qty 1

## 2018-02-12 MED ORDER — PROPOFOL 10 MG/ML IV BOLUS
INTRAVENOUS | Status: AC
  Filled 2018-02-12: qty 20

## 2018-02-12 MED ORDER — FENTANYL CITRATE (PF) 250 MCG/5ML IJ SOLN
INTRAMUSCULAR | Status: AC
  Filled 2018-02-12: qty 5

## 2018-02-12 MED ORDER — DEXAMETHASONE SODIUM PHOSPHATE 10 MG/ML IJ SOLN
INTRAMUSCULAR | Status: AC
  Filled 2018-02-12: qty 1

## 2018-02-12 MED ORDER — ACETAMINOPHEN 500 MG PO TABS
1000.0000 mg | ORAL_TABLET | ORAL | Status: AC
  Administered 2018-02-12: 1000 mg via ORAL
  Filled 2018-02-12: qty 2

## 2018-02-12 MED ORDER — KETOROLAC TROMETHAMINE 15 MG/ML IJ SOLN
15.0000 mg | Freq: Four times a day (QID) | INTRAMUSCULAR | Status: DC
  Administered 2018-02-12 – 2018-02-13 (×3): 15 mg via INTRAVENOUS
  Filled 2018-02-12 (×4): qty 1

## 2018-02-12 MED ORDER — BUPIVACAINE HCL 0.25 % IJ SOLN
INTRAMUSCULAR | Status: DC | PRN
  Administered 2018-02-12: 20 mL

## 2018-02-12 MED ORDER — NON FORMULARY: 1.0000 [IU] | Freq: Three times a day (TID) | Status: DC

## 2018-02-12 MED ORDER — SUGAMMADEX SODIUM 200 MG/2ML IV SOLN
INTRAVENOUS | Status: DC | PRN
  Administered 2018-02-12: 100 mg via INTRAVENOUS

## 2018-02-12 MED ORDER — PROPOFOL 10 MG/ML IV BOLUS
INTRAVENOUS | Status: DC | PRN
  Administered 2018-02-12: 90 mg via INTRAVENOUS

## 2018-02-12 MED ORDER — ONDANSETRON HCL 4 MG/2ML IJ SOLN
INTRAMUSCULAR | Status: AC
  Filled 2018-02-12: qty 2

## 2018-02-12 MED ORDER — METHYLPHENIDATE HCL ER (OSM) 36 MG PO TBCR: 36.0000 mg | EXTENDED_RELEASE_TABLET | Freq: Every day | ORAL | Status: DC | PRN

## 2018-02-12 MED ORDER — FENTANYL CITRATE (PF) 100 MCG/2ML IJ SOLN: 25.0000 ug | INTRAMUSCULAR | Status: DC | PRN

## 2018-02-12 MED ORDER — HYDROMORPHONE HCL 1 MG/ML IJ SOLN: 0.5000 mg | INTRAMUSCULAR | Status: DC | PRN

## 2018-02-12 MED ORDER — DEXMEDETOMIDINE HCL IN NACL 200 MCG/50ML IV SOLN
INTRAVENOUS | Status: AC
  Filled 2018-02-12: qty 50

## 2018-02-12 SURGICAL SUPPLY — 52 items
ATTRACTOMAT 16X20 MAGNETIC DRP (DRAPES) ×3 IMPLANT
BLADE EXTENDED COATED 6.5IN (ELECTRODE) ×3 IMPLANT
CHLORAPREP W/TINT 26ML (MISCELLANEOUS) ×3 IMPLANT
CLIP VESOCCLUDE LG 6/CT (CLIP) ×3 IMPLANT
CLIP VESOCCLUDE MED 6/CT (CLIP) ×3 IMPLANT
CONT SPEC 4OZ CLIKSEAL STRL BL (MISCELLANEOUS) ×6 IMPLANT
COVER WAND RF STERILE (DRAPES) ×3 IMPLANT
DRAPE INCISE IOBAN 66X45 STRL (DRAPES) IMPLANT
DRAPE UNDERBUTTOCKS STRL (DRAPE) ×3 IMPLANT
DRAPE WARM FLUID 44X44 (DRAPE) ×3 IMPLANT
DRSG OPSITE POSTOP 4X6 (GAUZE/BANDAGES/DRESSINGS) ×3 IMPLANT
DRSG OPSITE POSTOP 4X8 (GAUZE/BANDAGES/DRESSINGS) IMPLANT
ELECT REM PT RETURN 15FT ADLT (MISCELLANEOUS) ×3 IMPLANT
GAUZE 4X4 16PLY RFD (DISPOSABLE) ×3 IMPLANT
GAUZE SPONGE 4X4 12PLY STRL (GAUZE/BANDAGES/DRESSINGS) IMPLANT
GLOVE BIO SURGEON STRL SZ 6 (GLOVE) ×3 IMPLANT
GLOVE BIO SURGEON STRL SZ 6.5 (GLOVE) ×2 IMPLANT
GLOVE BIO SURGEONS STRL SZ 6.5 (GLOVE) ×1
GLOVE BIOGEL PI IND STRL 7.0 (GLOVE) ×2 IMPLANT
GLOVE BIOGEL PI INDICATOR 7.0 (GLOVE) ×4
GLOVE SURG SS PI 6.5 STRL IVOR (GLOVE) ×3 IMPLANT
GOWN STRL REUS W/ TWL LRG LVL3 (GOWN DISPOSABLE) ×1 IMPLANT
GOWN STRL REUS W/TWL LRG LVL3 (GOWN DISPOSABLE) ×8 IMPLANT
HANDLE SUCTION POOLE (INSTRUMENTS) IMPLANT
HEMOSTAT ARISTA ABSORB 3G PWDR (MISCELLANEOUS) ×3 IMPLANT
KIT BASIN OR (CUSTOM PROCEDURE TRAY) ×3 IMPLANT
LIGASURE IMPACT 36 18CM CVD LR (INSTRUMENTS) ×3 IMPLANT
NEEDLE HYPO 22GX1.5 SAFETY (NEEDLE) ×6 IMPLANT
NS IRRIG 1000ML POUR BTL (IV SOLUTION) IMPLANT
PACK GENERAL/GYN (CUSTOM PROCEDURE TRAY) ×3 IMPLANT
RETAINER VISCERA MED (MISCELLANEOUS) IMPLANT
SEPRAFILM MEMBRANE 5X6 (MISCELLANEOUS) IMPLANT
SHEET LAVH (DRAPES) ×3 IMPLANT
SPONGE LAP 18X18 RF (DISPOSABLE) ×3 IMPLANT
SUCTION POOLE HANDLE (INSTRUMENTS)
SUT MNCRL AB 4-0 PS2 18 (SUTURE) ×12 IMPLANT
SUT PDS AB 1 TP1 96 (SUTURE) ×6 IMPLANT
SUT PLAIN 2 0 XLH (SUTURE) IMPLANT
SUT PROLENE 0 SH 30 (SUTURE) ×3 IMPLANT
SUT SILK 2 0 (SUTURE)
SUT SILK 2-0 18XBRD TIE 12 (SUTURE) IMPLANT
SUT VIC AB 0 CT1 18XCR BRD 8 (SUTURE) ×1 IMPLANT
SUT VIC AB 0 CT1 36 (SUTURE) ×9 IMPLANT
SUT VIC AB 0 CT1 8-18 (SUTURE) ×2
SUT VIC AB 3-0 SH 27 (SUTURE) ×4
SUT VIC AB 3-0 SH 27XBRD (SUTURE) ×2 IMPLANT
SUT VIC AB 4-0 PS2 18 (SUTURE) ×6 IMPLANT
SUT VICRYL 0 TIES 12 18 (SUTURE) ×3 IMPLANT
SYR 30ML LL (SYRINGE) ×6 IMPLANT
TOWEL OR 17X26 10 PK STRL BLUE (TOWEL DISPOSABLE) ×3 IMPLANT
TOWEL OR NON WOVEN STRL DISP B (DISPOSABLE) ×3 IMPLANT
TRAY FOLEY MTR SLVR 14FR STAT (SET/KITS/TRAYS/PACK) ×3 IMPLANT

## 2018-02-12 NOTE — H&P (View-Only) (Signed)
[place] at [facility] H&P   Consult was requested by Dr. Dollene Primrose for a large pelvic mass (cyst) that reformed after [facility] drainage.     ONCOLOGIC SUMMARY 1. TBD o .  HPI: [name]  is a nice 22 y.o.  P0  Patient states she has noted a mobile mass in her abdomen for approximately 5 years. She thought this was just part of her anatomy.  She noted about 2 weeks of discomfort that increased to signficant pain and presented to her Gyn. She was taken for urgent surgery after a large pelvic mass was diagnosed.  The patient underwent laparoscopic drainage of a 25 cm ovarian cyst per office notes fluid was obtained and consistent with benign cells in addition a biopsy of the ovarian capsule was performed and.  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  In this setting a mucinous component cannot be entirely excluded.  No atypia or malignancy is seen in the sample tissue.    The patient then presented for repeat ultrasound which revealed either residual or reformed cystic lesion: Trans-vaginal ultrasound 10/26/2017 showing a uterus 8 x 3.4 x 3 cm a left ovary 4.5 x 2.7 x 1.5 cm.  The right ovary was 4.4 x 4 x 2 cm with a cyst measuring 13 x 14 x 9 cm.  Description is bilateral ovaries are seen the right ovary has a thin-walled cyst measuring 3 cm with low-level echoes suspect hemorrhagic cyst it is avascular.  The left adnexa reveals a large thin-walled cyst which was seen transabdominally 14 x 9 x 16 hyperechoic area was seen on the posterior wall of the cyst which was avascular left ovary was seen at the fundus of the uterus with blood flow.  Cul-de-sac showed minimal free fluid.  IUD was seen within the endometrium in proper position.  She is referred for management and recommendations. She denies N/V. Has normal BM. Denies fevers. Has a good appetite.  Since her  initial visit she had a CT and a MRI.  The lesion is >20cm at this time. The liver lesion on CT was felt to be c/w benign disease  Thus we are moving forward with resection via midline laparotomy incision.  Imported EPIC Oncologic History:   No history exists.    Measurement of disease:  TBD .   Radiology: . TVUS as noted above; nothing available for formal review on first visit  Outpatient Encounter Medications as of 11/09/2017  Medication Sig  . lamoTRIgine (LAMICTAL) 200 MG tablet Take 1 tablet by mouth daily.  Marland Kitchen levonorgestrel (MIRENA) 20 MCG/24HR IUD by Intrauterine route.  . methylphenidate 36 MG PO CR tablet Take 36 mg by mouth as needed. Patient states that she takes this medication as needed on her bad days.  . Multiple Vitamin (MULTIVITAMIN) tablet Take 1 tablet by mouth daily.   No facility-administered encounter medications on file as of 11/09/2017.    No Known Allergies  Past Medical History:  Diagnosis Date  . Bipolar 2 disorder ([place]) 02/2017   Past Surgical History:  Procedure Laterality Date  . DIAGNOSTIC LAPAROSCOPY    . EXPLORATORY LAPAROTOMY     Dr. Gerarda Fraction 02-12-18  . PELVIC LAPAROSCOPY  07/2017  . TONSILLECTOMY AND ADENOIDECTOMY  2003  . WISDOM TOOTH EXTRACTION  06/2016        Past Gynecological History:   GYNECOLOGIC HISTORY:  . No  LMP recorded. (Menstrual status: IUD).  . Menarche: 22 years old . P 0 . Contraceptive Mirena IUD . HRT NA  . Last Pap 08/2017 negative  Family Hx:  Family History  Problem Relation Age of Onset  . Uterine cancer Other   . Diabetes Mother   . Thyroid disease Mother   . Hypertension Father   . Diabetes Maternal Grandmother   . Rheum arthritis Maternal Grandmother    Social Hx:  Marland Kitchen Tobacco use: +ECig . Alcohol use: none . Illicit Drug use: Marijuana    Review of Systems: Review of Systems  Gastrointestinal: Positive for abdominal pain.  All other systems reviewed and are negative.   Vitals: Blood pressure  136/65, pulse 68, temperature 98.4 F (36.9 C), temperature source Oral, resp. rate 20, height 5\' 6"  (1.676 m), weight 207 lb 4.8 oz (94 kg), SpO2 100 %. Vitals:   11/09/17 0916  Weight: 207 lb 4.8 oz (94 kg)  Height: 5\' 6"  (1.676 m)    Vitals:   02/12/18 0821  BP: 129/76  Pulse: 70  Resp: 18  Temp: 98.6 F (37 C)  SpO2: 100%   Body mass index is 32.89 kg/m.   Physical Exam: General :  Well developed, 22 y.o., female in no apparent distress HEENT:  Normocephalic/atraumatic, symmetric, EOMI, eyelids normal Neck:   No visible masses.  Respiratory:  Respirations unlabored, no use of accessory muscles CV:   Deferred Breast:  Deferred Musculoskeletal: Normal muscle strength. Abdomen:  Visible fullness Extremities:  No visible edema or deformities Skin:   Normal inspection Neuro/Psych:  No focal motor deficit, no abnormal mental status. Normal gait. Normal affect. Alert and oriented to person, place, and time      Assessment  Pelvic mass  Plan  1. Data reviewed ? CT and MRI since her initial visit. 2. Pelvic mass ? Etiology most likely reformation of cystic lesion previously drained. ? CTAP as above  3. Management ? Given the lesion size I recommend surgical resection. ? At this point we have to forego [facility] attempt given the lesion size. She is aware she will stay at least 1possibly 2-3 nights ? Regardless of pathology we will plan fertility-sparing procedure (ie retention of the contralateral ovary and uterus.) o Surgical risks, benefits, and alternatives were reviewed using the surgical sketch and she was given a copy.  HAS an IUD   [name], MD  02/12/2018, 10:47 AM

## 2018-02-12 NOTE — Transfer of Care (Signed)
Immediate Anesthesia Transfer of Care Note  Patient: Kaylee Porter  Procedure(s) Performed: EXPLORATORY LAPAROTOMY (N/A ) LEFT  SALPINGECTOMY (N/A )  Patient Location: PACU  Anesthesia Type:General  Level of Consciousness: awake, alert , oriented and patient cooperative  Airway & Oxygen Therapy: Patient Spontanous Breathing and Patient connected to face mask oxygen  Post-op Assessment: Report given to RN, Post -op Vital signs reviewed and stable and Patient moving all extremities  Post vital signs: Reviewed and stable  Last Vitals:  Vitals Value Taken Time  BP 133/80 02/12/2018  1:20 PM  Temp    Pulse 68 02/12/2018  1:23 PM  Resp 17 02/12/2018  1:23 PM  SpO2 100 % 02/12/2018  1:23 PM  Vitals shown include unvalidated device data.  Last Pain:  Vitals:   02/12/18 0839  TempSrc:   PainSc: 0-No pain      Patients Stated Pain Goal: 5 (28/76/81 1572)  Complications: No apparent anesthesia complications

## 2018-02-12 NOTE — Op Note (Signed)
Date: 02/12/18  Preoperative Diagnosis:  1. Pelvic mass  Postoperative Diagnosis: Same.   Procedure(s) Performed:  1. 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 Pelvic washings   Surgeon: Mart Piggs, MD  Assistant Surgeon: Lahoma Crocker, M.D. (an MD assistant was necessary for tissue manipulation, management of instrumentation, retraction and positioning due to the complexity of the case and hospital policies).  Anesthesia: GETA  Specimens: washings, left adnexal cyst and left fallopian tube  Estimated Blood Loss: 30 mL.    Complications: None.   Operative Findings: ~20cm left paratubal/broad ligament cyst encompassing the left fallopian tube, drained clear fluid. The left ovary appeared normal. There was minor pelvic adhesive disease. Benign appearing cyst on frozen section with areas of proliferation, no atypia.  Procedure in Detail:    The patient was taken the operating room.  General endotracheal anesthesia was performed without complications.  The patient was placed in dorsal lithotomy position.  She was prepped and draped in sterile fashion.  A timeout was performed.     A Foley catheter was placed to gravity by me.   A low midline vertical incision was made and carried through the subcutaneous tissue to the fascia. The fascial incision was made and extended superiorally and inferiorally. The rectus muscles were separated. The peritoneum was identified and entered sharply. Peritoneal incision was extended longitudinally.  Exploration revealed the above findings. An Alexis retractor was then placed. Washings were collected.   I began the procedure by identifying the structural components to the cyst and an avascular region. A 0-prolene pursestring suture was placed and the gallbladder trocar inserted into the center tying the pursestring around to create controlled aspiration. Clear fluid was drained and the lesion  was able to be delivered through the incision. Examination of the tissue showed the fallopian tube was splayed across the surface of the cyst and the ovary was normal in appearance. The cyst itself was lateralized and away from the ovary. The broad ligament was opened superiorly and the cyst wall shelled away from the ovary and IP ligament. The remainder of the cyst wall was taken off the broad ligament, all intact. At the inferior portion the medial tubal segment was taken off the uterine insertion site by clamping, transecting, and ligating. The mass was sent for frozen section.  Irrigation was used. The open broad ligament surfaces were irrigated. Arista was placed along the broad ligament surfaces and the superior margins were re-appoximated in a running fashion with 3-0 vicryl.    Frozen section returned with areas of proliferation, no obvious atypia, defer to permanent.   Initial lap counts were correct.   The fascia was reapproximated with two #1-looped PDS using a total of two sutures, starting at either end, meeting in the midline and tying. The subcutaneous layer was then irrigated copiously.   The dermis was reapproximated with interrupted 4-0 Vicryl and then running 4-0 Monocryl. The skin was closed with Dermabond.   The patient tolerated the procedure well. Sponge, lap, and needle counts were correct x 2.

## 2018-02-12 NOTE — Progress Notes (Signed)
[place] at [facility] H&P   Consult was requested by Dr. Dollene Primrose for a large pelvic mass (cyst) that reformed after [facility] drainage.     ONCOLOGIC SUMMARY 1. TBD o .  HPI: [name]  is a nice 22 y.o.  P0  Patient states she has noted a mobile mass in her abdomen for approximately 5 years. She thought this was just part of her anatomy.  She noted about 2 weeks of discomfort that increased to signficant pain and presented to her Gyn. She was taken for urgent surgery after a large pelvic mass was diagnosed.  The patient underwent laparoscopic drainage of a 25 cm ovarian cyst per office notes fluid was obtained and consistent with benign cells in addition a biopsy of the ovarian capsule was performed and.  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  In this setting a mucinous component cannot be entirely excluded.  No atypia or malignancy is seen in the sample tissue.    The patient then presented for repeat ultrasound which revealed either residual or reformed cystic lesion: Trans-vaginal ultrasound 10/26/2017 showing a uterus 8 x 3.4 x 3 cm a left ovary 4.5 x 2.7 x 1.5 cm.  The right ovary was 4.4 x 4 x 2 cm with a cyst measuring 13 x 14 x 9 cm.  Description is bilateral ovaries are seen the right ovary has a thin-walled cyst measuring 3 cm with low-level echoes suspect hemorrhagic cyst it is avascular.  The left adnexa reveals a large thin-walled cyst which was seen transabdominally 14 x 9 x 16 hyperechoic area was seen on the posterior wall of the cyst which was avascular left ovary was seen at the fundus of the uterus with blood flow.  Cul-de-sac showed minimal free fluid.  IUD was seen within the endometrium in proper position.  She is referred for management and recommendations. She denies N/V. Has normal BM. Denies fevers. Has a good appetite.  Since her  initial visit she had a CT and a MRI.  The lesion is >20cm at this time. The liver lesion on CT was felt to be c/w benign disease  Thus we are moving forward with resection via midline laparotomy incision.  Imported EPIC Oncologic History:   No history exists.    Measurement of disease:  TBD .   Radiology: . TVUS as noted above; nothing available for formal review on first visit  Outpatient Encounter Medications as of 11/09/2017  Medication Sig  . lamoTRIgine (LAMICTAL) 200 MG tablet Take 1 tablet by mouth daily.  Marland Kitchen levonorgestrel (MIRENA) 20 MCG/24HR IUD by Intrauterine route.  . methylphenidate 36 MG PO CR tablet Take 36 mg by mouth as needed. Patient states that she takes this medication as needed on her bad days.  . Multiple Vitamin (MULTIVITAMIN) tablet Take 1 tablet by mouth daily.   No facility-administered encounter medications on file as of 11/09/2017.    No Known Allergies  Past Medical History:  Diagnosis Date  . Bipolar 2 disorder ([place]) 02/2017   Past Surgical History:  Procedure Laterality Date  . DIAGNOSTIC LAPAROSCOPY    . EXPLORATORY LAPAROTOMY     Dr. Gerarda Fraction 02-12-18  . PELVIC LAPAROSCOPY  07/2017  . TONSILLECTOMY AND ADENOIDECTOMY  2003  . WISDOM TOOTH EXTRACTION  06/2016        Past Gynecological History:   GYNECOLOGIC HISTORY:  . No  LMP recorded. (Menstrual status: IUD).  . Menarche: 22 years old . P 0 . Contraceptive Mirena IUD . HRT NA  . Last Pap 08/2017 negative  Family Hx:  Family History  Problem Relation Age of Onset  . Uterine cancer Other   . Diabetes Mother   . Thyroid disease Mother   . Hypertension Father   . Diabetes Maternal Grandmother   . Rheum arthritis Maternal Grandmother    Social Hx:  Marland Kitchen Tobacco use: +ECig . Alcohol use: none . Illicit Drug use: Marijuana    Review of Systems: Review of Systems  Gastrointestinal: Positive for abdominal pain.  All other systems reviewed and are negative.   Vitals: Blood pressure  136/65, pulse 68, temperature 98.4 F (36.9 C), temperature source Oral, resp. rate 20, height 5\' 6"  (1.676 m), weight 207 lb 4.8 oz (94 kg), SpO2 100 %. Vitals:   11/09/17 0916  Weight: 207 lb 4.8 oz (94 kg)  Height: 5\' 6"  (1.676 m)    Vitals:   02/12/18 0821  BP: 129/76  Pulse: 70  Resp: 18  Temp: 98.6 F (37 C)  SpO2: 100%   Body mass index is 32.89 kg/m.   Physical Exam: General :  Well developed, 22 y.o., female in no apparent distress HEENT:  Normocephalic/atraumatic, symmetric, EOMI, eyelids normal Neck:   No visible masses.  Respiratory:  Respirations unlabored, no use of accessory muscles CV:   Deferred Breast:  Deferred Musculoskeletal: Normal muscle strength. Abdomen:  Visible fullness Extremities:  No visible edema or deformities Skin:   Normal inspection Neuro/Psych:  No focal motor deficit, no abnormal mental status. Normal gait. Normal affect. Alert and oriented to person, place, and time      Assessment  Pelvic mass  Plan  1. Data reviewed ? CT and MRI since her initial visit. 2. Pelvic mass ? Etiology most likely reformation of cystic lesion previously drained. ? CTAP as above  3. Management ? Given the lesion size I recommend surgical resection. ? At this point we have to forego [facility] attempt given the lesion size. She is aware she will stay at least 1possibly 2-3 nights ? Regardless of pathology we will plan fertility-sparing procedure (ie retention of the contralateral ovary and uterus.) o Surgical risks, benefits, and alternatives were reviewed using the surgical sketch and she was given a copy.  HAS an IUD   [name], MD  02/12/2018, 10:47 AM

## 2018-02-12 NOTE — Discharge Summary (Signed)
Physician Discharge Summary  Patient ID: Kaylee Porter MRN: [id number] DOB/AGE: 1995/12/27 22 y.o.  Admit date: 02/12/2018 Discharge date: 02/13/2018  Admission Diagnoses: Active Problems:   Pelvic mass in female Discharge Diagnoses:  Active Problems:   Pelvic mass in female   Discharged Condition: good  Hospital Course: On 02/12/2018, the patient underwent the following: Procedure(s): EXPLORATORY LAPAROTOMY LEFT  SALPINGECTOMY.   The postoperative course was uneventful.  She was discharged to home on postoperative day 1 tolerating a regular diet.  Consults: None  Significant Diagnostic Studies: None  Treatments: surgery: see above  Discharge Exam: Blood pressure 109/65, pulse (!) 54, temperature 98.1 F (36.7 C), temperature source Oral, resp. rate 18, height 5\' 7"  (1.702 m), weight 210 lb (95.3 kg), SpO2 100 %. General appearance: alert and no distress Resp: clear to auscultation bilaterally Cardio: regular rate and rhythm GI: soft, non-tender; bowel sounds normal; no masses,  no organomegaly Extremities: extremities normal, atraumatic, no cyanosis or edema  Disposition: Discharge disposition: 01-Home or Self Care       Discharge Instructions    Activity as tolerated - No restrictions   Complete by:  As directed    Call MD for:  extreme fatigue   Complete by:  As directed    Call MD for:  persistant dizziness or light-headedness   Complete by:  As directed    Call MD for:  persistant nausea and vomiting   Complete by:  As directed    Call MD for:  redness, tenderness, or signs of infection (pain, swelling, redness, odor or green/yellow discharge around incision site)   Complete by:  As directed    Call MD for:  severe uncontrolled pain   Complete by:  As directed    Call MD for:  temperature >100.4   Complete by:  As directed    Diet - low sodium heart healthy   Complete by:  As directed    Diet - low sodium heart healthy   Complete by:  As directed    Diet Carb Modified   Complete by:  As directed    Diet general   Complete by:  As directed    Discharge instructions   Complete by:  As directed    Return to work: 2 weeks  Activity: 1. Be up and out of the bed during the day.  Take a nap if needed.  You may walk up steps but be careful and use the hand rail.  Stair climbing will tire you more than you think, you may need to stop part way and rest.   2. No lifting or straining for 6 weeks.  3. No driving for 1-2 weeks.  Do Not drive if you are taking narcotic pain medicine.  4. Shower daily.  Use soap and water on your incision and pat dry; don't rub.   5. No sexual activity and nothing in the vagina for 4 weeks.  Diet: 1. Low sodium Heart Healthy Diet is recommended.  2. It is safe to use a laxative if you have difficulty moving your bowels.   Wound Care: 1. Keep clean and dry.  Shower daily.  Reasons to call the Doctor:  Fever - Oral temperature greater than 100.4 degrees Fahrenheit Foul-smelling vaginal discharge Difficulty urinating Nausea and vomiting Increased pain at the site of the incision that is unrelieved with pain medicine. Difficulty breathing with or without chest pain New calf pain especially if only on one side Sudden, continuing increased vaginal bleeding with or  without clots.   Follow-up: 1. See Precious Haws in 4 weeks.  Contacts: For questions or concerns you should contact:  Dr. Precious Haws at [phone]  or at [place]   Discharge wound care:   Complete by:  As directed    Keep clean and dry   Driving Restrictions   Complete by:  As directed    No driving for 1- 2 weeks   Increase activity slowly   Complete by:  As directed    Increase activity slowly   Complete by:  As directed    Lifting restrictions   Complete by:  As directed    No lifting > 10 lbs for 6 weeks   May shower / Bathe   Complete by:  As directed    No tub baths for 6 weeks   Sexual Activity  Restrictions   Complete by:  As directed    No intercourse for 6 - 8 weeks     Allergies as of 02/13/2018   No Known Allergies     Medication List    TAKE these medications   lamoTRIgine 200 MG tablet Commonly known as:  LAMICTAL Take 200 mg by mouth at bedtime.   levonorgestrel 20 MCG/24HR IUD Commonly known as:  MIRENA 1 each by Intrauterine route once.   lithium carbonate 300 MG capsule Take 300 mg by mouth at bedtime. 150 mg rotates takes 300mg  when in school   methylphenidate 36 MG CR tablet Commonly known as:  CONCERTA Take 36 mg by mouth daily as needed (for ADD).   oxyCODONE-acetaminophen 5-325 MG tablet Commonly known as:  PERCOCET/ROXICET Take 1-2 tablets by mouth every 4 (four) hours as needed for up to 3 days for severe pain.            Discharge Care Instructions  (From admission, onward)         Start     Ordered   02/12/18 0000  Discharge wound care:    Comments:  Keep clean and dry   02/12/18 1354         Follow-up Information    [name], MD.   Specialty:  Gynecologic Oncology Why:  call to be seen within 1-2 weeks Contact information: [place] [address] [phone]           Signed: [name] 02/13/2018, 6:18 AM

## 2018-02-12 NOTE — Anesthesia Postprocedure Evaluation (Signed)
Anesthesia Post Note  Patient: Kaylee Porter  Procedure(s) Performed: EXPLORATORY LAPAROTOMY (N/A ) LEFT  SALPINGECTOMY (N/A )     Patient location during evaluation: PACU Anesthesia Type: General Level of consciousness: awake and alert Pain management: pain level controlled Vital Signs Assessment: post-procedure vital signs reviewed and stable Respiratory status: spontaneous breathing, nonlabored ventilation, respiratory function stable and patient connected to nasal cannula oxygen Cardiovascular status: blood pressure returned to baseline and stable Postop Assessment: no apparent nausea or vomiting Anesthetic complications: no    Last Vitals:  Vitals:   02/12/18 1430 02/12/18 1541  BP: 128/74 126/89  Pulse: 61 (!) 56  Resp: 14 16  Temp: 36.9 C 36.6 C  SpO2: 100% 100%    Last Pain:  Vitals:   02/12/18 1541  TempSrc: Oral  PainSc:                  Markeith Jue L Sabiha Sura

## 2018-02-12 NOTE — Discharge Instructions (Signed)
Salpingectomy, Care After This sheet gives you information about how to care for yourself after your procedure. Your health care provider may also give you more specific instructions. If you have problems or questions, contact your health care provider. What can I expect after the procedure? After your procedure, it is common to have:  Pain in your abdomen.  Some occasional vaginal bleeding (spotting).  Tiredness. Follow these instructions at home: Incision care   Keep your incision area and your bandage (dressing) clean and dry.  Follow instructions from your health care provider about how to take care of your incision. Make sure you: ? Wash your hands with soap and water before you change your dressing. If soap and water are not available, use hand sanitizer. ? Change your dressing astold by your health care provider. ? Leave stitches (sutures), staples, skin glue, or adhesive strips in place. These skin closures may need to stay in place for 2 weeks or longer. If adhesive strip edges start to loosen and curl up, you may trim the loose edges. Do not remove adhesive strips completely unless your health care provider tells you to do that.  Check your incision area every day for signs of infection. Check for: ? More redness, swelling, or pain. ? More fluid or blood. ? Warmth. ? Pus or a bad smell. Activity   Do not drive or use heavy machinery while taking prescription pain medicine.  Do not drive for 24 hours if you received a medicine to help you relax (sedative).  Rest as directed by your health care provider. Ask your health care provider what activities are safe for you. You should avoid: ? Lifting anything that is heavier than 10 lb (4.5 kg) until your health care provider approves. ? Activities that require a lot of energy.  Until your health care provider approves: ? Do not douche. ? Do not use tampons. ? Do not have sexual intercourse. General instructions  Take  over-the-counter and prescription medicines only as told by your health care provider.  To prevent or treat constipation while you are taking prescription pain medicine, your health care provider may recommend that you: ? Drink enough fluid to keep your urine clear or pale yellow. ? Take over-the-counter or prescription medicines. ? Eat foods that are high in fiber, such as fresh fruits and vegetables, whole grains, and beans. ? Limit foods that are high in fat and processed sugars, such as fried and sweet foods.  Do not take baths, swim, or use a hot tub until your health care provider approves. You may take showers.  Wear compression stockings as told by your health care provider. These stockings help to prevent blood clots and reduce swelling in your legs.  Keep all follow-up visits as told by your health care provider. This is important. Contact a health care provider if:  You have: ? Pain when you urinate. ? More redness, swelling, or pain around your incision. ? More fluid or blood coming from your incision. ? Pus or a bad smell coming from your incision. ? A fever. ? Abdominal pain that gets worse or does not get better with medicine.  Your incision feels warm to the touch.  Your incision starts to break open.  You develop a rash.  You develop nausea and vomiting.  You feel light-headed. Get help right away if:  You develop pain in your chest or leg.  You develop shortness of breath.  You faint.  You have increased vaginal bleeding. This  information is not intended to replace advice given to you by your health care provider. Make sure you discuss any questions you have with your health care provider. Document Released: 05/13/2010 Document Revised: 10/06/2015 Document Reviewed: 10/07/2015 Elsevier Interactive Patient Education  [employer].

## 2018-02-12 NOTE — Anesthesia Procedure Notes (Signed)
Procedure Name: Intubation Date/Time: 02/12/2018 11:22 AM Performed by: Mitzie Na, CRNA Pre-anesthesia Checklist: Patient identified, Emergency Drugs available, Suction available, Patient being monitored and Timeout performed Patient Re-evaluated:Patient Re-evaluated prior to induction Oxygen Delivery Method: Circle system utilized Preoxygenation: Pre-oxygenation with 100% oxygen Induction Type: IV induction Ventilation: Mask ventilation without difficulty and Oral airway inserted - appropriate to patient size Laryngoscope Size: Mac and 3 Grade View: Grade I Tube type: Oral Tube size: 7.0 mm Number of attempts: 1 Airway Equipment and Method: Stylet Placement Confirmation: ETT inserted through vocal cords under direct vision,  positive ETCO2 and breath sounds checked- equal and bilateral Secured at: 23 cm Tube secured with: Tape Dental Injury: Teeth and Oropharynx as per pre-operative assessment

## 2018-02-12 NOTE — Anesthesia Preprocedure Evaluation (Addendum)
Anesthesia Evaluation  Patient identified by MRN, date of birth, ID band Patient awake    Reviewed: Allergy & Precautions, NPO status , Patient's Chart, lab work & pertinent test results  Airway Mallampati: I  TM Distance: >3 FB Neck ROM: Full  Mouth opening: Limited Mouth Opening  Dental no notable dental hx. (+) Teeth Intact, Dental Advisory Given   Pulmonary neg pulmonary ROS,    Pulmonary exam normal breath sounds clear to auscultation       Cardiovascular negative cardio ROS Normal cardiovascular exam Rhythm:Regular Rate:Normal     Neuro/Psych PSYCHIATRIC DISORDERS Bipolar Disorder negative neurological ROS     GI/Hepatic negative GI ROS, Neg liver ROS,   Endo/Other  negative endocrine ROS  Renal/GU negative Renal ROS  negative genitourinary   Musculoskeletal negative musculoskeletal ROS (+)   Abdominal   Peds  Hematology negative hematology ROS (+)   Anesthesia Other Findings Pelvic mass  Reproductive/Obstetrics                            Anesthesia Physical Anesthesia Plan  ASA: II  Anesthesia Plan: General   Post-op Pain Management:    Induction: Intravenous  PONV Risk Score and Plan: 3 and Ondansetron, Dexamethasone and Midazolam  Airway Management Planned: Oral ETT  Additional Equipment:   Intra-op Plan:   Post-operative Plan: Extubation in OR  Informed Consent: I have reviewed the patients History and Physical, chart, labs and discussed the procedure including the risks, benefits and alternatives for the proposed anesthesia with the patient or authorized representative who has indicated his/her understanding and acceptance.   Dental advisory given  Plan Discussed with: CRNA  Anesthesia Plan Comments:         Anesthesia Quick Evaluation

## 2018-02-12 NOTE — Interval H&P Note (Signed)
History and Physical Interval Note:  02/12/2018 10:52 AM  Kaylee Porter  has presented today for surgery, with the diagnosis of PELVIC MASS  The various methods of treatment have been discussed with the patient and family. After consideration of risks, benefits and other options for treatment, the patient has consented to  Procedure(s): EXPLORATORY LAPAROTOMY (N/A) UNILATERAL SALPINGO OOPHORECTOMY, POSSIBLE STAGING (N/A) as a surgical intervention .  The patient's history has been reviewed, patient examined, no change in status, stable for surgery.  I have reviewed the patient's chart and labs.  Questions were answered to the patient's satisfaction.     [name]

## 2018-02-13 ENCOUNTER — Encounter (HOSPITAL_COMMUNITY): Payer: Self-pay | Admitting: Obstetrics

## 2018-02-13 DIAGNOSIS — D4959 Neoplasm of unspecified behavior of other genitourinary organ: Secondary | ICD-10-CM | POA: Diagnosis not present

## 2018-02-13 LAB — TYPE AND SCREEN
ABO/RH(D): O POS
Antibody Screen: NEGATIVE

## 2018-02-13 LAB — HEMOGLOBIN: Hemoglobin: 12 g/dL (ref 12.0–15.0)

## 2018-02-18 ENCOUNTER — Telehealth: Payer: Self-pay

## 2018-02-18 NOTE — Telephone Encounter (Signed)
Outgoing call to patient regarding "how she is doing post-op?"  -per Joylene John NP.  Pt reports she is doing 'really good'  First day took Tylenol and rested and now doesn't need Tylenol at all now.  Reports she is eating, urinating well, and had a bm.  Denies any issues.  Encouraged to call for questions or concerns.  No other needs per her at this time.  Confirmed she has appt on 1-7.

## 2018-02-26 ENCOUNTER — Encounter: Payer: Self-pay | Admitting: Obstetrics

## 2018-02-26 ENCOUNTER — Inpatient Hospital Stay: Payer: Managed Care, Other (non HMO) | Attending: Obstetrics | Admitting: Obstetrics

## 2018-02-26 VITALS — BP 147/76 | HR 84 | Temp 98.3°F | Resp 16 | Ht 66.0 in | Wt 205.0 lb

## 2018-02-26 DIAGNOSIS — N838 Other noninflammatory disorders of ovary, fallopian tube and broad ligament: Secondary | ICD-10-CM

## 2018-02-26 DIAGNOSIS — D271 Benign neoplasm of left ovary: Secondary | ICD-10-CM | POA: Insufficient documentation

## 2018-02-26 DIAGNOSIS — Z9889 Other specified postprocedural states: Secondary | ICD-10-CM | POA: Insufficient documentation

## 2018-02-26 NOTE — Progress Notes (Signed)
S: Patient returns after taken to surgery for Exlap/pelvic mass resection (ie cystectomy) on 02/12/18. The tumor was on her left fallopian tube/broad ligament. 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 Washings were negative It was removed intact.  She returns for an early postop check/path review. She starts school in [place] next week. She plans to work (at [employer]) this weekend with activity restrictions.  O:  Vitals:   02/26/18 1528  BP: (!) 147/76  Pulse: 84  Resp: 16  Temp: 98.3 F (36.8 C)  SpO2: 100%   Wound CDI.  A/P She was given a copy of her operative and pathology reports today We discussed the nature of borderline tumors and the low risk for recurrence. However she had a cystectomy alone. I offered removal of the entire adnexa versus close observation. She wants to return to school and will follow back in the summer to re-review options. I will plan to draw CA125 on her return (Preop CA125 was normal) We will also order a TVUS on her return. If she has any issues from surgery or develops any abdominal distension she knows to call. I can always have her see a colleague in [place] if the need arises.

## 2018-02-26 NOTE — Patient Instructions (Addendum)
Return in 6 months for pelvic Call if any concerns or needing followup in [place] May return to work 03/02/18 but no lifting/pulling/pushing greater than 5 pounds until after 04/08/18. After 04/08/18 may return to full activity Please call our office at [phone] at the end of May 2020 or the beginning of June 2020 to schedule a July 2020 appointment.

## 2018-08-26 ENCOUNTER — Telehealth: Payer: Self-pay | Admitting: *Deleted

## 2018-08-26 NOTE — Telephone Encounter (Signed)
Patient called and asked for a referral back to Dr. Marvel Plan for follow up care. Dr. Marvel Plan was the referring doctor to Dr. Gerarda Fraction. Records faxed

## 2018-09-02 ENCOUNTER — Telehealth: Payer: Self-pay | Admitting: *Deleted

## 2018-09-02 NOTE — Telephone Encounter (Signed)
Patient called and stated that Dr. Juliane Lack Richardson's office had not received her records. Explained to the patient that we had faxed them on 7/6 but would refax them today. Records sent

## 2019-03-30 IMAGING — CT CT ABD-PELV W/ CM
2 of 4 series · 16 of 46 positions shown, 18 images · IV contrast (APPLIED)
Comparison: None.

CLINICAL DATA: Lower abdominal and pelvic pain. Adnexal mass.
Approximately 3 months status post ovarian cyst removal.

EXAM:
CT ABDOMEN AND PELVIS WITH CONTRAST
TECHNIQUE: Multidetector CT imaging of the abdomen and pelvis was performed
using the standard protocol following bolus administration of
intravenous contrast.
CONTRAST:  75mL OMNIPAQUE IOHEXOL 300 MG/ML  SOLN

[Series 2: axial st · axial · 0.84mm/px · z∈[-776,-371]mm · 13 of 91 slices shown, 15 images]
[im 5/91  soft-tissue]
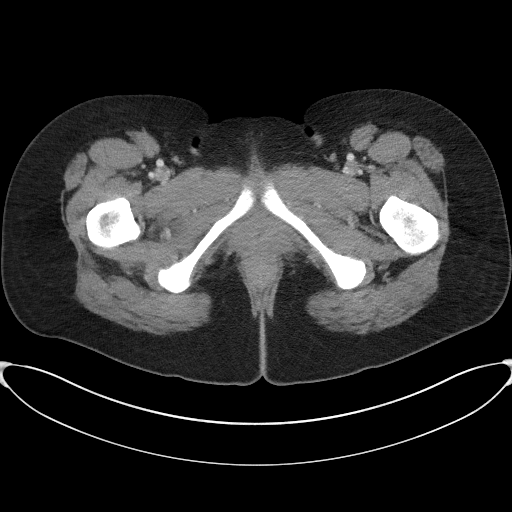
[im 5/91  bone]
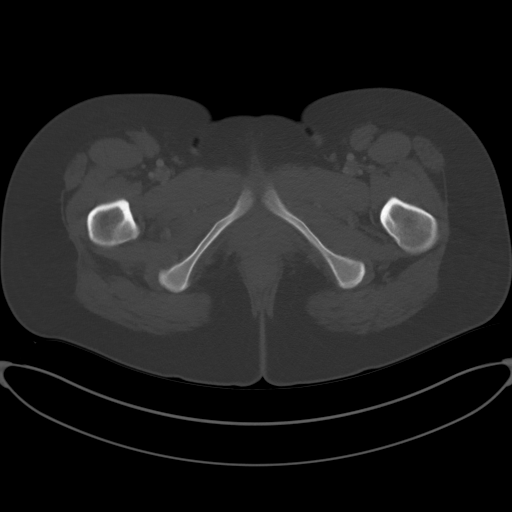
[im 13/91  soft-tissue]
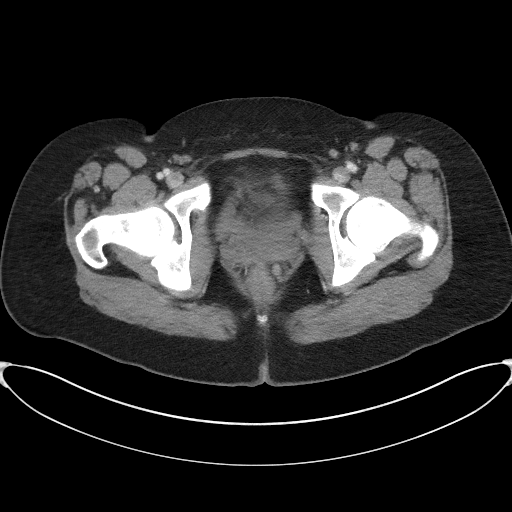
[im 21/91  soft-tissue]
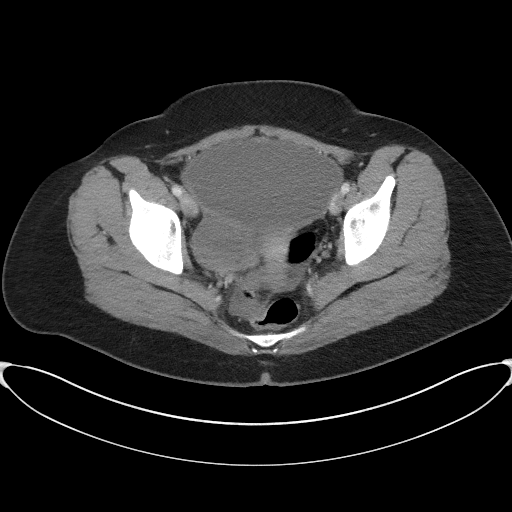
[im 25/91  soft-tissue]
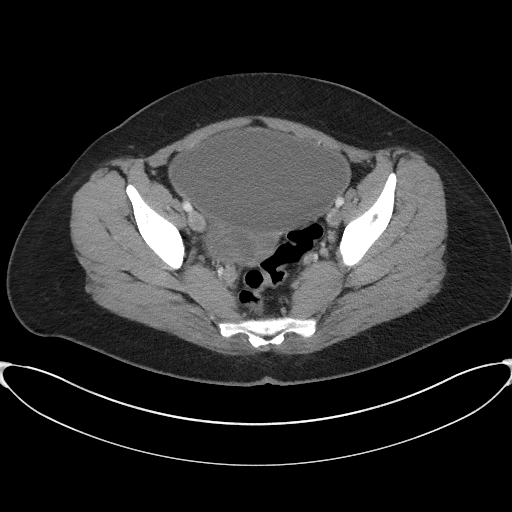
[im 33/91  soft-tissue]
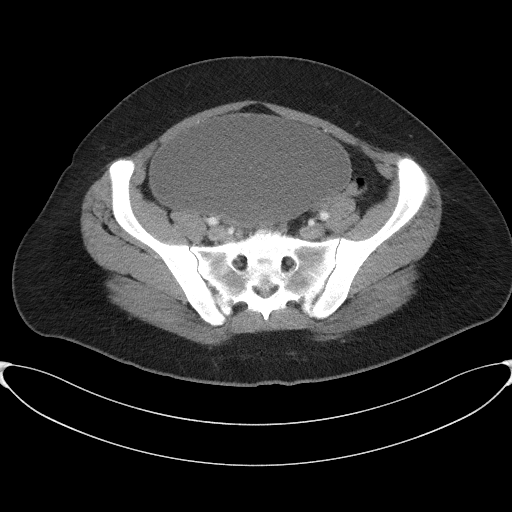
[im 37/91  soft-tissue]
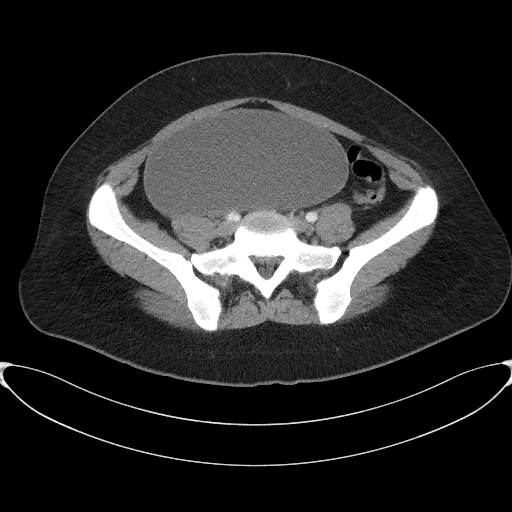
[im 46/91  soft-tissue]
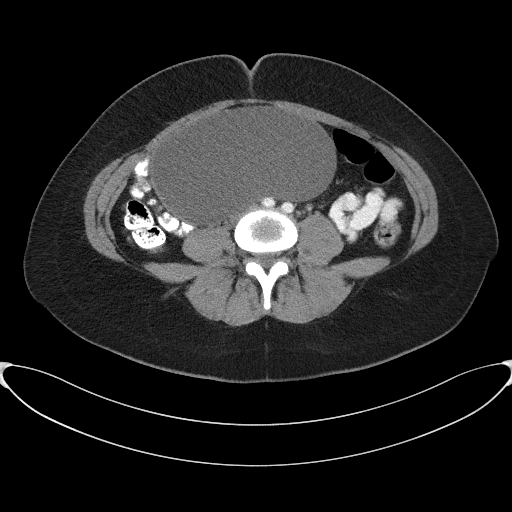
[im 54/91  soft-tissue]
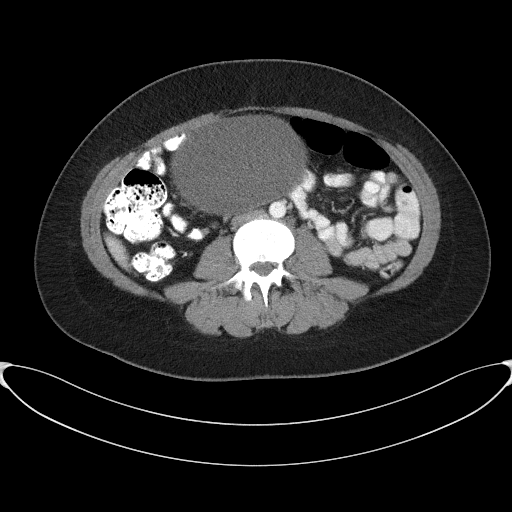
[im 58/91  soft-tissue]
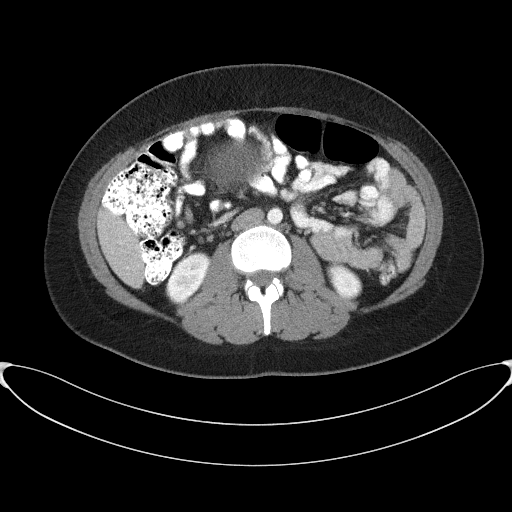
[im 58/91  bone]
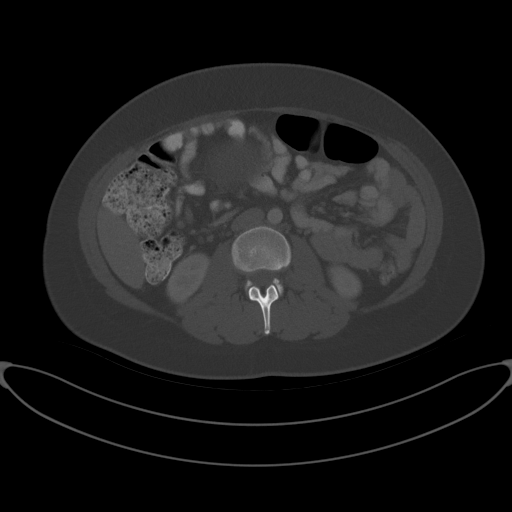
[im 66/91  soft-tissue]
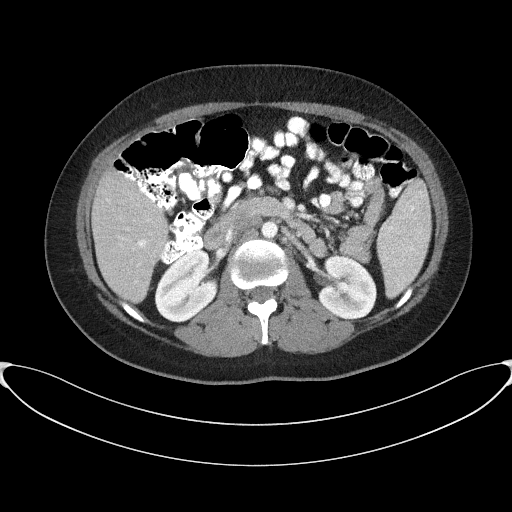
[im 70/91  soft-tissue]
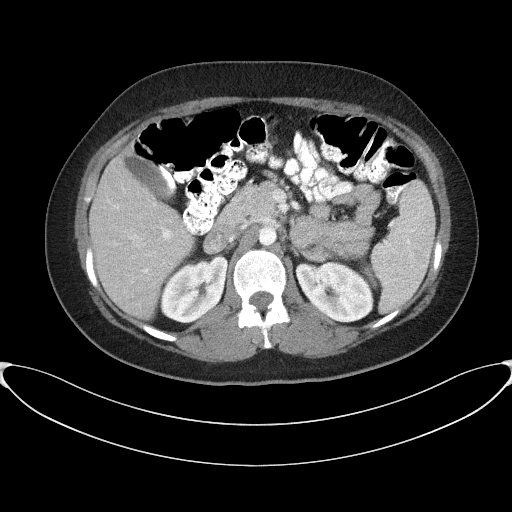
[im 78/91  soft-tissue]
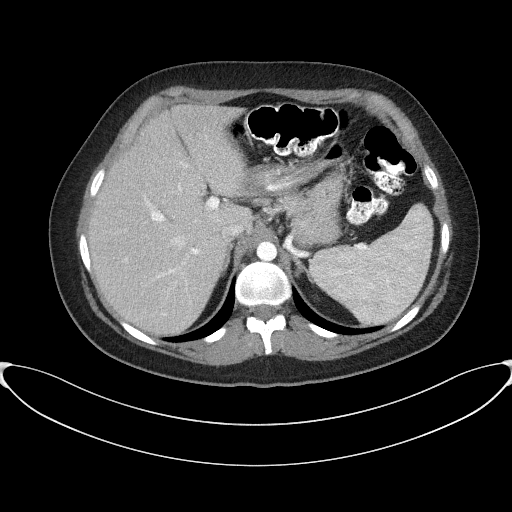
[im 86/91  soft-tissue]
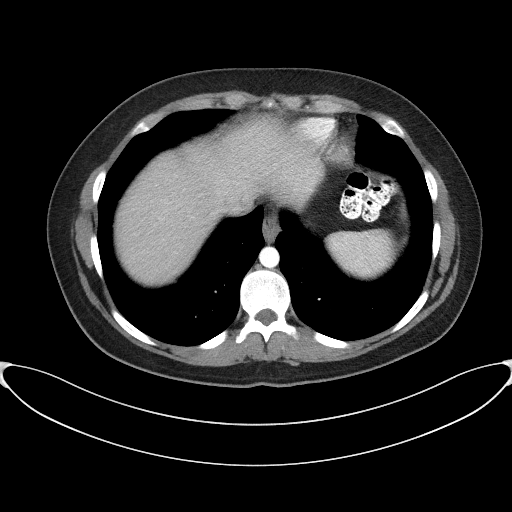

[Series 5: coronal st · coronal · 0.74mm/px · 3 of 90 slices shown]
[im 30/90  soft-tissue]
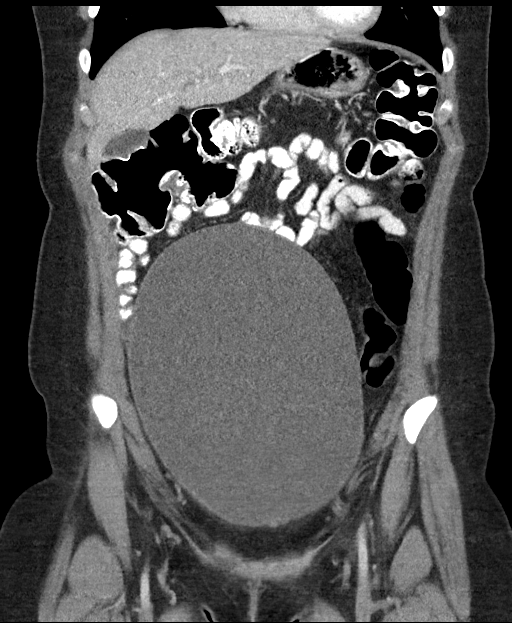
[im 40/90  soft-tissue]
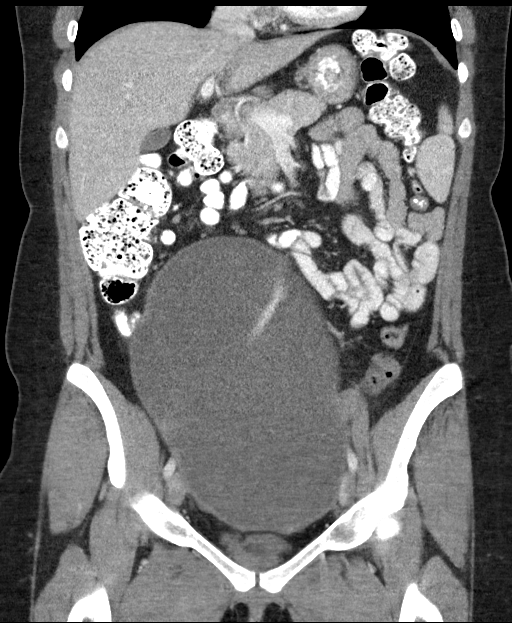
[im 50/90  soft-tissue]
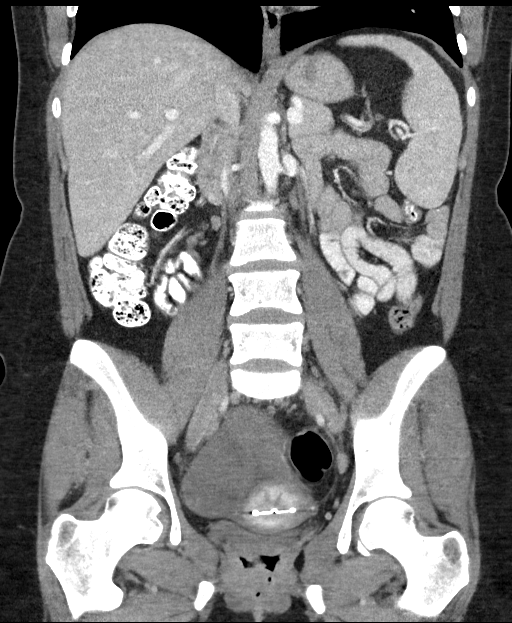

[16 of 46 positions shown; findings below may reference images not displayed]

FINDINGS: Lower Chest: No acute findings.

Hepatobiliary: A subtle hypervascular lesion is seen in the
posterior right hepatic lobe measuring 1.4 cm on image [DATE]. This
likely represents a benign hemangioma, focal nodular hyperplasia, or
less likely adenoma. Gallbladder is unremarkable.

Pancreas:  No mass or inflammatory changes.

Spleen: Within normal limits in size and appearance.

Adrenals/Urinary Tract: No masses identified. No evidence of
hydronephrosis. Urinary bladder is empty.

Stomach/Bowel: No evidence of obstruction, inflammatory process or
abnormal fluid collections.

Vascular/Lymphatic: No pathologically enlarged lymph nodes. No
abdominal aortic aneurysm.

Reproductive: Uterus is normal appearance. IUD is seen in
appropriate position.

A large cystic mass is seen which appears to originate in the right
adnexa extends into the mid abdomen. This shows thickened septations
and possible solid components inferiorly. This lesion measures
x 9.0 by 16.7 cm, and is highly suspicious for cystic ovarian
neoplasm. Tiny amount of free fluid seen in the pelvic cul-de-sac,
however no definite peritoneal thickening or nodularity is seen.

Other:  None.

Musculoskeletal:  No suspicious bone lesions identified.
IMPRESSION: 23 cm complex cystic mass in the right adnexa and lower abdomen,
highly suspicious for cystic ovarian neoplasm.

Tiny amount of free fluid in pelvis, without other definite findings
of metastatic disease.

1.4 cm hypervascular lesion in the posterior right hepatic lobe,
likely representing a benign hemangioma, focal nodular hyperplasia,
or less likely adenoma. Recommend abdomen MRI without and with
Eovist hepatobiliary contrast agent for further characterization.

These results will be called to the ordering clinician or
representative by the Radiologist Assistant, and communication
documented in the PACS or zVision Dashboard.
